# Patient Record
Sex: Female | Born: 1965 | Race: Black or African American | Hispanic: No | Marital: Married | Smoking: Never smoker
Health system: Southern US, Community
[De-identification: ages and names within clinical notes are randomized; demographics above are authoritative.]

## PROBLEM LIST (undated history)

## (undated) DIAGNOSIS — E119 Type 2 diabetes mellitus without complications: Secondary | ICD-10-CM

## (undated) DIAGNOSIS — I10 Essential (primary) hypertension: Secondary | ICD-10-CM

---

## 2005-08-29 ENCOUNTER — Inpatient Hospital Stay (HOSPITAL_COMMUNITY): Admission: AD | Admit: 2005-08-29 | Discharge: 2005-08-29 | Payer: Self-pay | Admitting: Obstetrics and Gynecology

## 2005-09-06 ENCOUNTER — Ambulatory Visit: Payer: Self-pay | Admitting: *Deleted

## 2005-09-11 ENCOUNTER — Ambulatory Visit: Payer: Self-pay | Admitting: *Deleted

## 2005-09-13 ENCOUNTER — Ambulatory Visit: Payer: Self-pay | Admitting: Family Medicine

## 2005-09-27 ENCOUNTER — Ambulatory Visit: Payer: Self-pay | Admitting: Family Medicine

## 2005-09-27 ENCOUNTER — Inpatient Hospital Stay (HOSPITAL_COMMUNITY): Admission: AD | Admit: 2005-09-27 | Discharge: 2005-10-03 | Payer: Self-pay | Admitting: *Deleted

## 2005-09-27 ENCOUNTER — Ambulatory Visit: Payer: Self-pay | Admitting: *Deleted

## 2006-08-14 IMAGING — US US RENAL
1 series · 14 of 25 positions shown · non-contrast
Comparison: none

CLINICAL DATA: Chronic hypertension. 
 RENAL/URINARY TRACT ULTRASOUND:
TECHNIQUE: Complete ultrasound examination of the urinary tract was performed including evaluation of the kidneys, renal collecting systems, and urinary bladder.

[Series 1: us renal · 0.34mm/px · 14 of 33 slices shown]
[im 1/33]
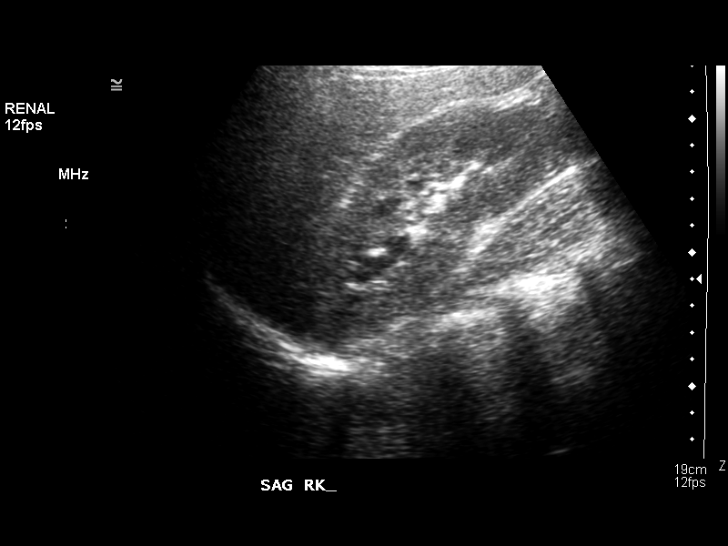
[im 3/33]
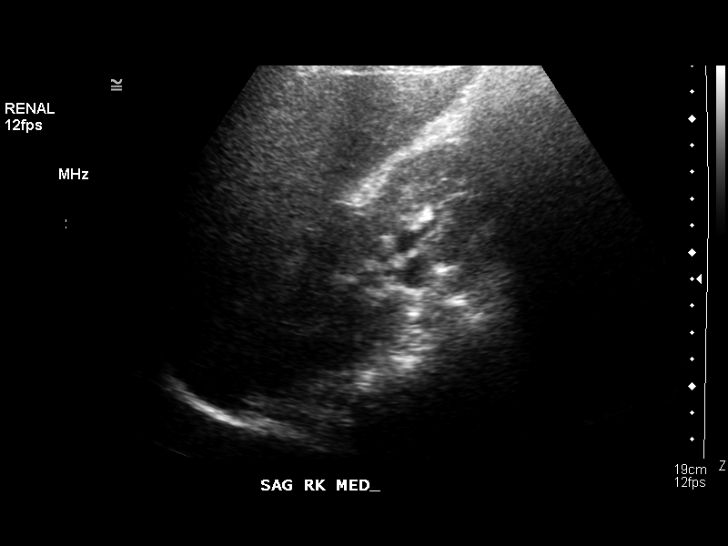
[im 6/33]
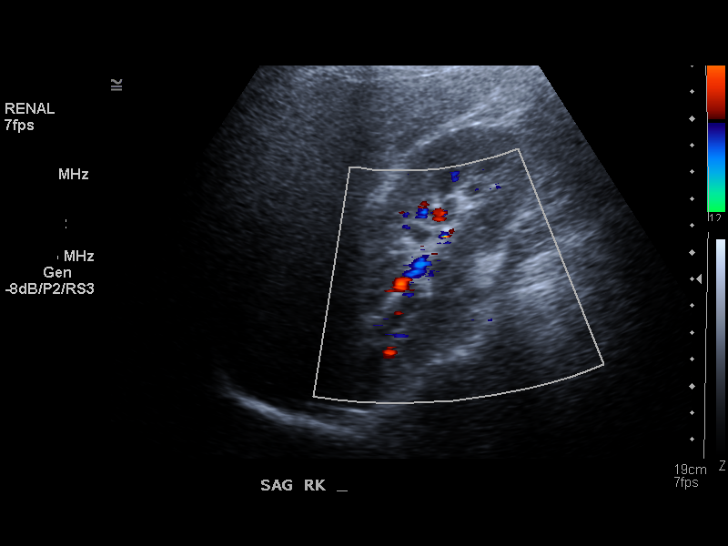
[im 9/33]
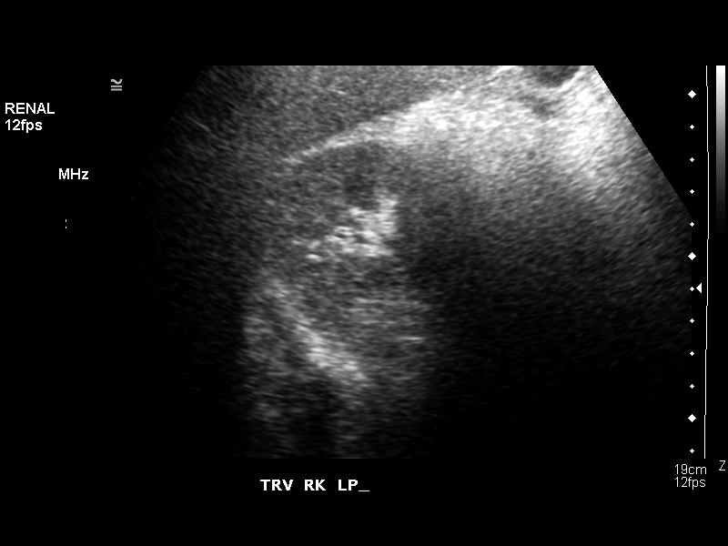
[im 11/33]
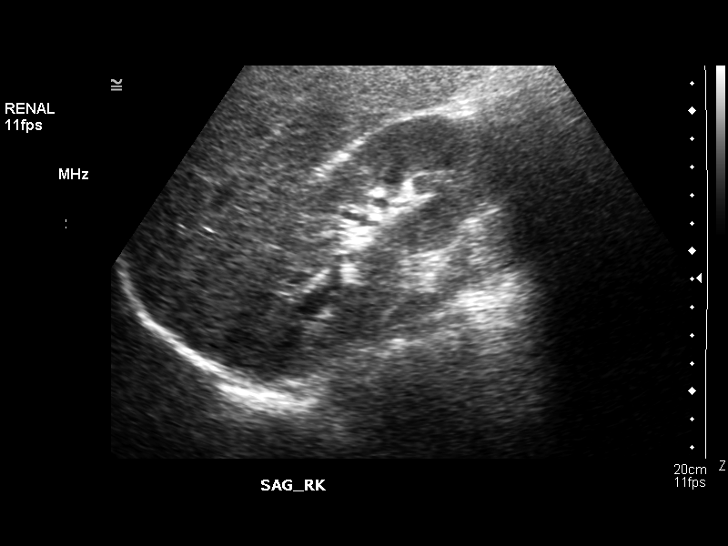
[im 13/33]
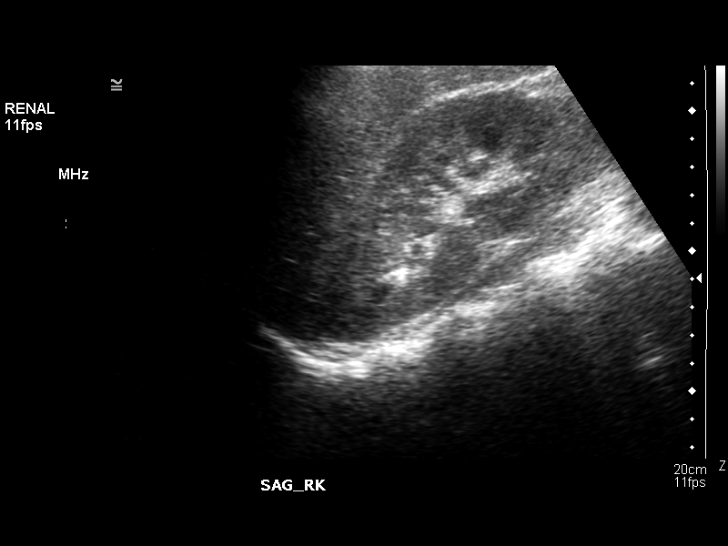
[im 15/33]
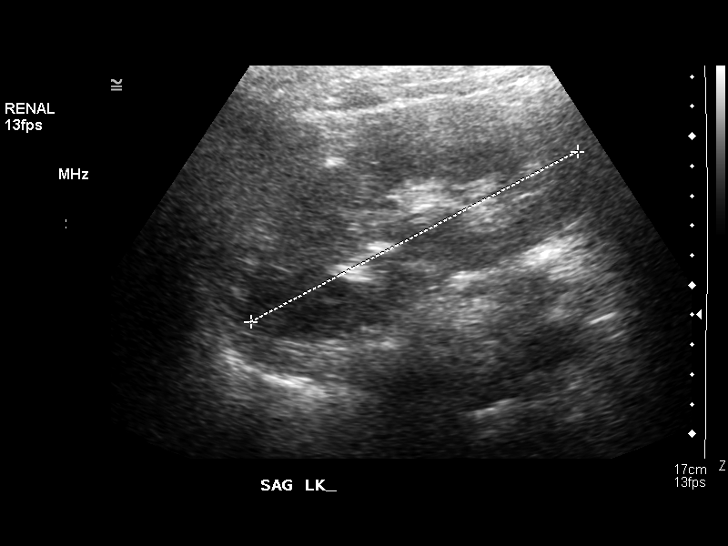
[im 18/33]
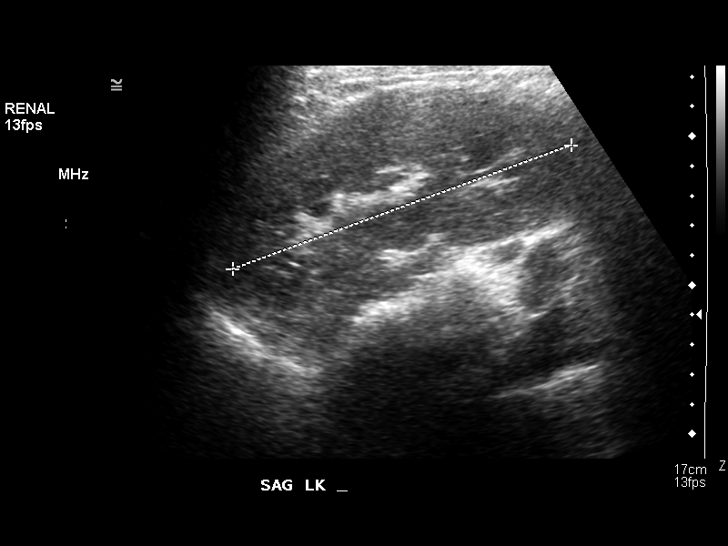
[im 21/33]
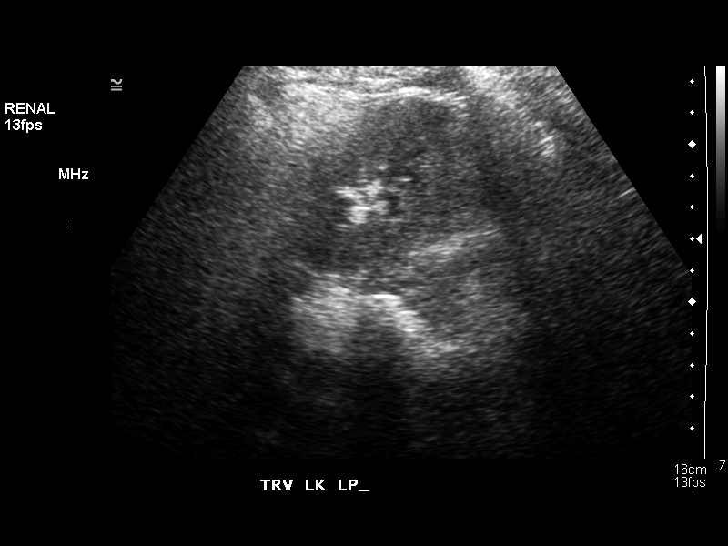
[im 22/33]
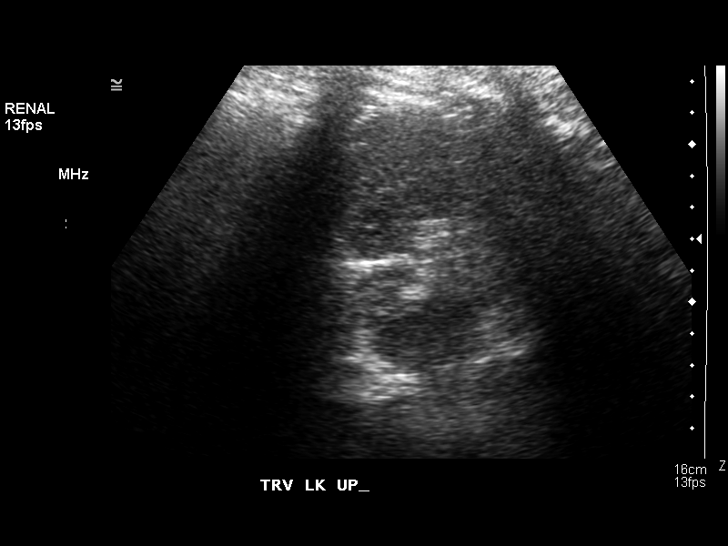
[im 25/33]
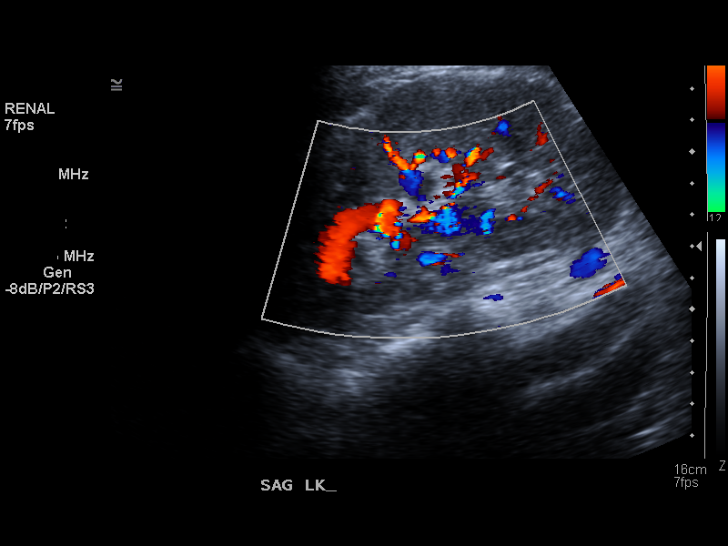
[im 27/33]
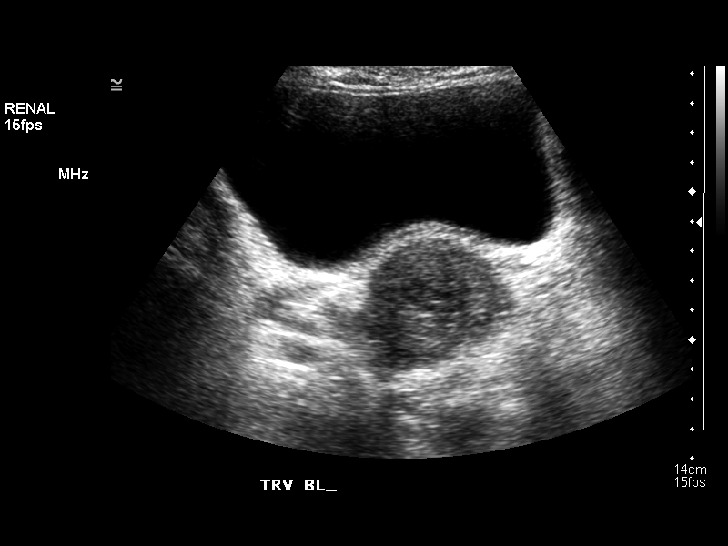
[im 30/33]
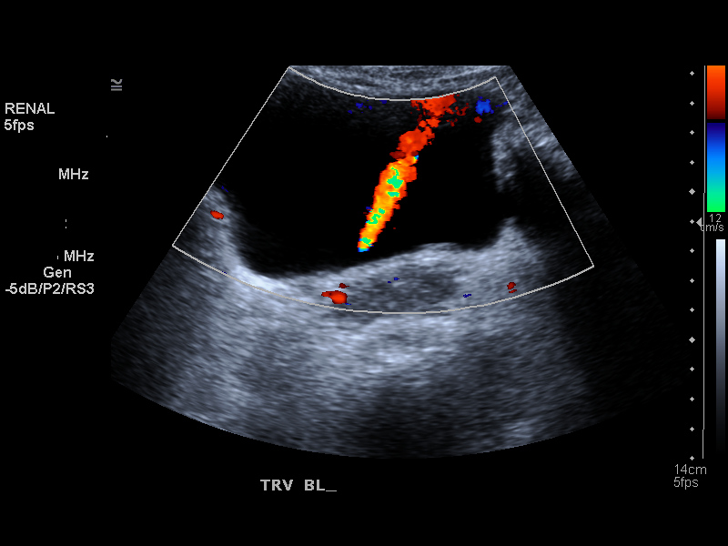
[im 33/33]
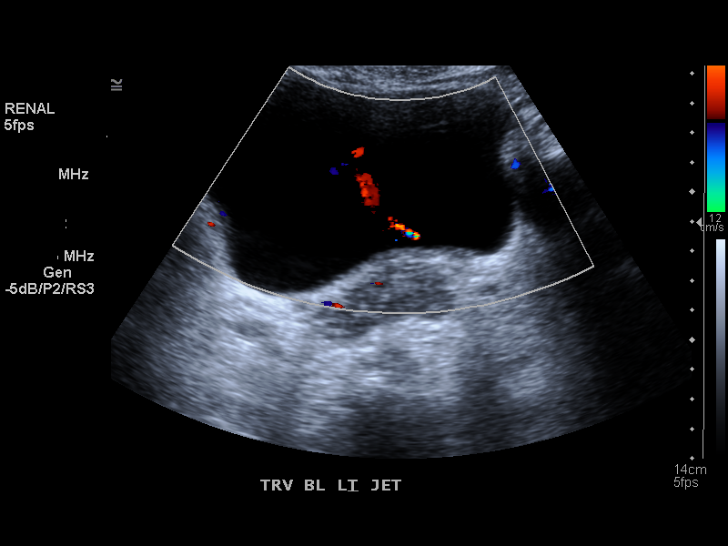

[14 of 25 positions shown; findings below may reference images not displayed]

FINDINGS: Both kidneys are symmetric and within normal limits in size with the right kidney measuring 11.9 cm and the left kidney measuring 12.3 cm.  Both kidneys are normal in parenchymal echogenicity and thickness.  No renal masses or cysts are seen.  There is no evidence of hydronephrosis involving either kidney.  No perinephric abnormalities are identified. 
 Images of the urinary bladder are unremarkable in appearance for the degree of bladder filling, and bilateral ureteral jets are also noted on color Doppler ultrasound.
IMPRESSION: Normal study.  Normal size and appearance of both kidneys.

## 2007-11-09 IMAGING — CR DG CHEST 1V
1 series · 1 of 1 positions shown · non-contrast
Comparison: NONE

CLINICAL DATA: +PPD 

CHEST - SINGLE VIEW (PA)

[view not recorded]
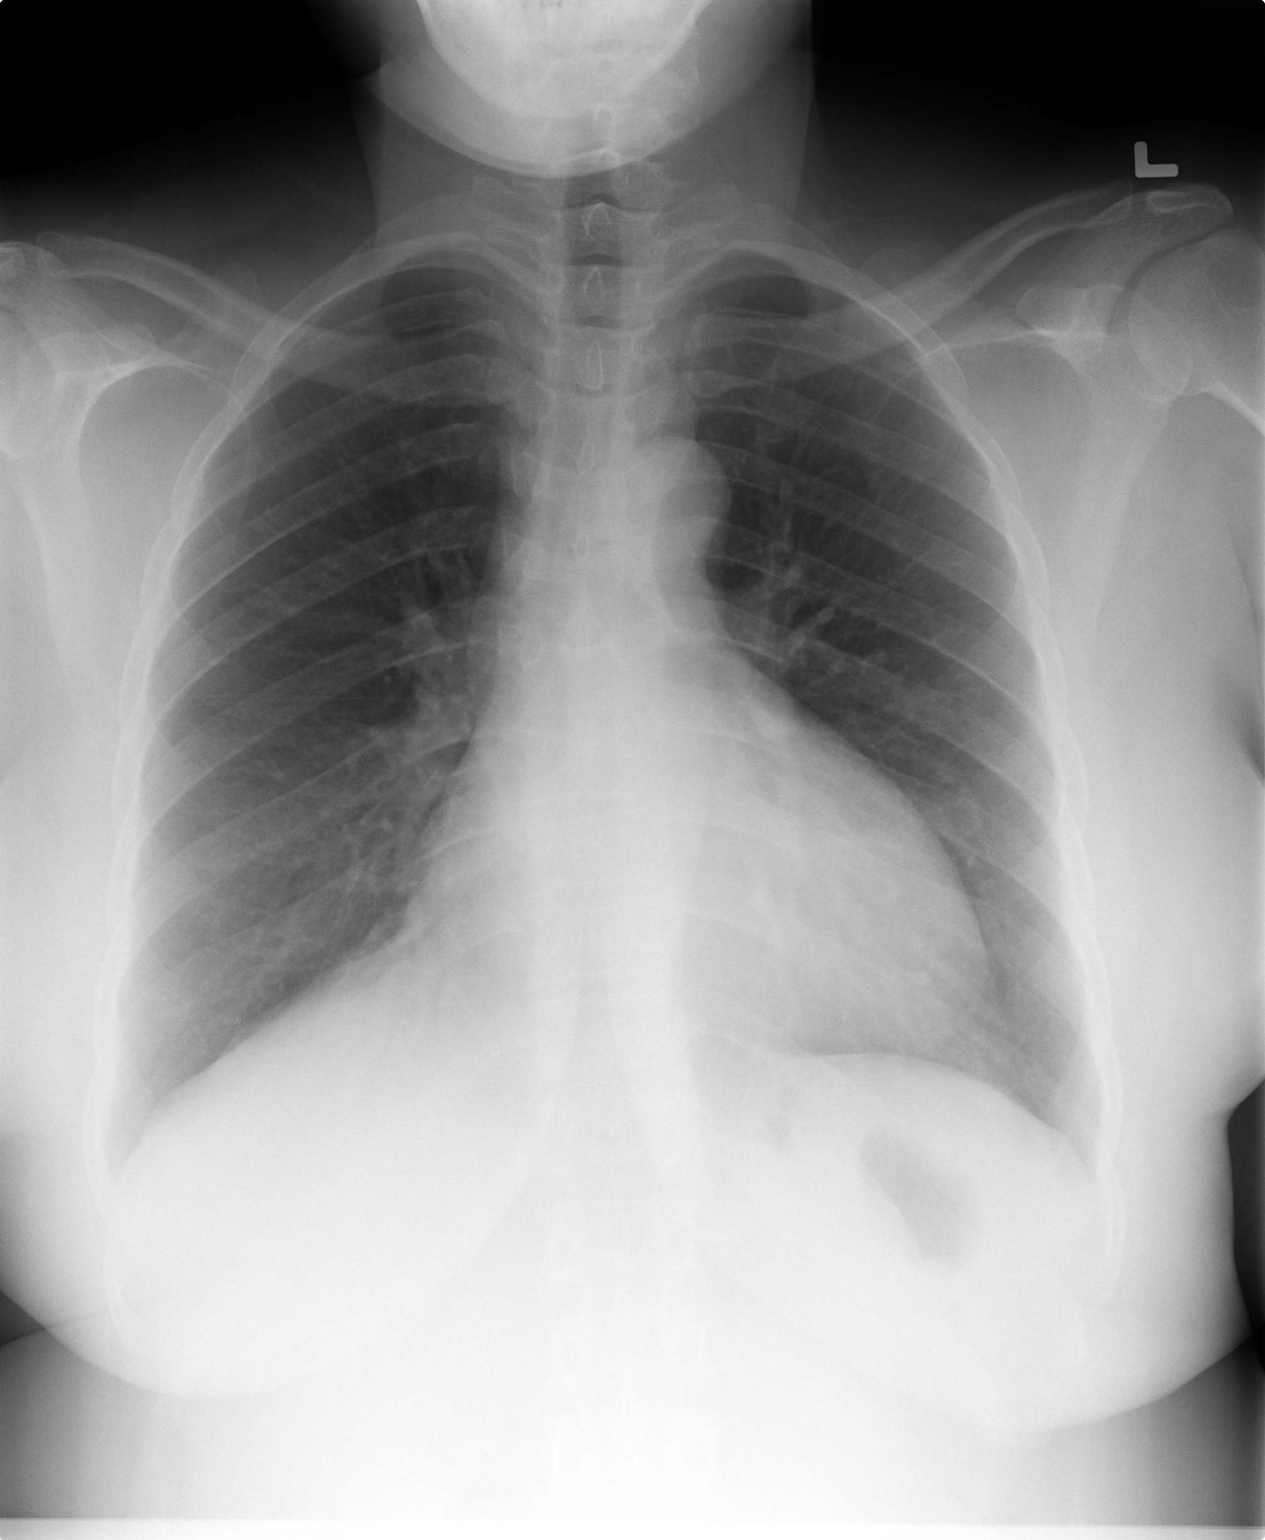

[1 of 1 positions shown; findings below may reference images not displayed]

FINDINGS: The heart size appears to be borderline enlarged. 
Mediastinum is within normal limits. No evidence of focal 
consolidation or interstitial edema. No pleural effusions or 
pneumothorax.
IMPRESSION: Borderline cardiomegaly. No acute pulmonary disease or 
pulmonary edema. No evidence of active tuberculosis. Irit Sagi 
12/30/2006  Tran Date: 12/30/2006 DAS  [REDACTED]

## 2022-12-09 ENCOUNTER — Inpatient Hospital Stay (HOSPITAL_COMMUNITY)
Admission: EM | Admit: 2022-12-09 | Discharge: 2022-12-13 | DRG: 286 | Disposition: A | Discharge status: Home / Self Care | Payer: BLUE CROSS/BLUE SHIELD | Attending: Internal Medicine | Admitting: Internal Medicine

## 2022-12-09 ENCOUNTER — Observation Stay (HOSPITAL_COMMUNITY): Payer: BLUE CROSS/BLUE SHIELD

## 2022-12-09 ENCOUNTER — Other Ambulatory Visit: Payer: Self-pay

## 2022-12-09 ENCOUNTER — Encounter (HOSPITAL_COMMUNITY): Payer: Self-pay | Admitting: Family Medicine

## 2022-12-09 ENCOUNTER — Emergency Department (HOSPITAL_COMMUNITY): Payer: BLUE CROSS/BLUE SHIELD

## 2022-12-09 DIAGNOSIS — E119 Type 2 diabetes mellitus without complications: Secondary | ICD-10-CM

## 2022-12-09 DIAGNOSIS — E876 Hypokalemia: Secondary | ICD-10-CM

## 2022-12-09 DIAGNOSIS — I5022 Chronic systolic (congestive) heart failure: Secondary | ICD-10-CM

## 2022-12-09 DIAGNOSIS — I110 Hypertensive heart disease with heart failure: Secondary | ICD-10-CM | POA: Diagnosis not present

## 2022-12-09 DIAGNOSIS — I509 Heart failure, unspecified: Secondary | ICD-10-CM

## 2022-12-09 DIAGNOSIS — E669 Obesity, unspecified: Secondary | ICD-10-CM | POA: Diagnosis present

## 2022-12-09 DIAGNOSIS — I5023 Acute on chronic systolic (congestive) heart failure: Secondary | ICD-10-CM | POA: Diagnosis present

## 2022-12-09 DIAGNOSIS — E861 Hypovolemia: Secondary | ICD-10-CM | POA: Diagnosis not present

## 2022-12-09 DIAGNOSIS — J9601 Acute respiratory failure with hypoxia: Secondary | ICD-10-CM

## 2022-12-09 DIAGNOSIS — I5031 Acute diastolic (congestive) heart failure: Secondary | ICD-10-CM

## 2022-12-09 DIAGNOSIS — E1165 Type 2 diabetes mellitus with hyperglycemia: Secondary | ICD-10-CM | POA: Diagnosis present

## 2022-12-09 DIAGNOSIS — I10 Essential (primary) hypertension: Secondary | ICD-10-CM | POA: Diagnosis present

## 2022-12-09 DIAGNOSIS — E66812 Obesity, class 2: Secondary | ICD-10-CM | POA: Diagnosis present

## 2022-12-09 DIAGNOSIS — I161 Hypertensive emergency: Secondary | ICD-10-CM

## 2022-12-09 DIAGNOSIS — I2510 Atherosclerotic heart disease of native coronary artery without angina pectoris: Secondary | ICD-10-CM | POA: Diagnosis present

## 2022-12-09 DIAGNOSIS — E871 Hypo-osmolality and hyponatremia: Secondary | ICD-10-CM

## 2022-12-09 DIAGNOSIS — E785 Hyperlipidemia, unspecified: Secondary | ICD-10-CM | POA: Diagnosis present

## 2022-12-09 DIAGNOSIS — I493 Ventricular premature depolarization: Secondary | ICD-10-CM | POA: Diagnosis present

## 2022-12-09 DIAGNOSIS — Z6836 Body mass index (BMI) 36.0-36.9, adult: Secondary | ICD-10-CM

## 2022-12-09 DIAGNOSIS — R739 Hyperglycemia, unspecified: Secondary | ICD-10-CM

## 2022-12-09 DIAGNOSIS — Z596 Low income: Secondary | ICD-10-CM

## 2022-12-09 HISTORY — DX: Type 2 diabetes mellitus without complications: E11.9

## 2022-12-09 HISTORY — DX: Essential (primary) hypertension: I10

## 2022-12-09 LAB — COMPREHENSIVE METABOLIC PANEL
ALT: 26 U/L (ref 0–44)
AST: 43 U/L — ABNORMAL HIGH (ref 15–41)
Albumin: 3.3 g/dL — ABNORMAL LOW (ref 3.5–5.0)
Alkaline Phosphatase: 107 U/L (ref 38–126)
Anion gap: 12 (ref 5–15)
BUN: 9 mg/dL (ref 6–20)
CO2: 23 mmol/L (ref 22–32)
Calcium: 9 mg/dL (ref 8.9–10.3)
Chloride: 98 mmol/L (ref 98–111)
Creatinine, Ser: 0.52 mg/dL (ref 0.44–1.00)
GFR, Estimated: >60 mL/min (ref >60)
Glucose, Bld: 411 mg/dL — ABNORMAL HIGH (ref 70–99)
Potassium: 3.4 mmol/L — ABNORMAL LOW (ref 3.5–5.1)
Sodium: 133 mmol/L — ABNORMAL LOW (ref 135–145)
Total Bilirubin: 0.7 mg/dL (ref 0.3–1.2)
Total Protein: 7.9 g/dL (ref 6.5–8.1)

## 2022-12-09 LAB — ECHOCARDIOGRAM COMPLETE
Area-P 1/2: 6.71 cm2
Height: 65 in
S' Lateral: 4.8 cm
Single Plane A4C EF: 25.8 %

## 2022-12-09 LAB — CBC WITH DIFFERENTIAL/PLATELET
Abs Immature Granulocytes: 0.05 10*3/uL (ref 0.00–0.07)
Basophils Absolute: 0.1 10*3/uL (ref 0.0–0.1)
Basophils Relative: 1 %
Eosinophils Absolute: 0.1 10*3/uL (ref 0.0–0.5)
Eosinophils Relative: 1 %
HCT: 40.1 % (ref 36.0–46.0)
Hemoglobin: 13.7 g/dL (ref 12.0–15.0)
Immature Granulocytes: 0 %
Lymphocytes Relative: 32 %
Lymphs Abs: 3.6 10*3/uL (ref 0.7–4.0)
MCH: 26.7 pg (ref 26.0–34.0)
MCHC: 34.2 g/dL (ref 30.0–36.0)
MCV: 78 fL — ABNORMAL LOW (ref 80.0–100.0)
Monocytes Absolute: 0.5 10*3/uL (ref 0.1–1.0)
Monocytes Relative: 5 %
Neutro Abs: 6.9 10*3/uL (ref 1.7–7.7)
Neutrophils Relative %: 61 %
Platelets: 203 10*3/uL (ref 150–400)
RBC: 5.14 MIL/uL — ABNORMAL HIGH (ref 3.87–5.11)
RDW: 13.6 % (ref 11.5–15.5)
WBC: 11.2 10*3/uL — ABNORMAL HIGH (ref 4.0–10.5)
nRBC: 0 % (ref 0.0–0.2)

## 2022-12-09 LAB — CBG MONITORING, ED
Glucose-Capillary: 328 mg/dL — ABNORMAL HIGH (ref 70–99)
Glucose-Capillary: 254 mg/dL — ABNORMAL HIGH (ref 70–99)
Glucose-Capillary: 110 mg/dL — ABNORMAL HIGH (ref 70–99)

## 2022-12-09 LAB — TROPONIN I (HIGH SENSITIVITY): Troponin I (High Sensitivity): 17 ng/L (ref <18)

## 2022-12-09 LAB — GLUCOSE, CAPILLARY: Glucose-Capillary: 275 mg/dL — ABNORMAL HIGH (ref 70–99)

## 2022-12-09 LAB — BRAIN NATRIURETIC PEPTIDE: B Natriuretic Peptide: 283.5 pg/mL — ABNORMAL HIGH (ref 0.0–100.0)

## 2022-12-09 LAB — HEMOGLOBIN A1C
Hgb A1c MFr Bld: 12.3 % — ABNORMAL HIGH (ref 4.8–5.6)
Mean Plasma Glucose: 306.31 mg/dL

## 2022-12-09 LAB — D-DIMER, QUANTITATIVE: D-Dimer, Quant: 0.78 ug/mL-FEU — ABNORMAL HIGH (ref 0.00–0.50)

## 2022-12-09 MED ORDER — NITROGLYCERIN IN D5W 200-5 MCG/ML-% IV SOLN
0.0000 ug/min | INTRAVENOUS | Status: DC
  Administered 2022-12-09: 5 ug/min via INTRAVENOUS
  Administered 2022-12-09: 10 ug/min via INTRAVENOUS
  Filled 2022-12-09: qty 250

## 2022-12-09 MED ORDER — ENOXAPARIN SODIUM 40 MG/0.4ML IJ SOSY
40.0000 mg | PREFILLED_SYRINGE | Freq: Every day | INTRAMUSCULAR | Status: DC
  Administered 2022-12-09 – 2022-12-10 (×2): 40 mg via SUBCUTANEOUS
  Filled 2022-12-09 (×2): qty 0.4

## 2022-12-09 MED ORDER — FUROSEMIDE 10 MG/ML IJ SOLN
40.0000 mg | Freq: Two times a day (BID) | INTRAMUSCULAR | Status: AC
  Administered 2022-12-09 (×2): 40 mg via INTRAVENOUS
  Filled 2022-12-09 (×2): qty 4

## 2022-12-09 MED ORDER — SODIUM CHLORIDE 0.9 % IV SOLN: 250.0000 mL | INTRAVENOUS | Status: DC | PRN

## 2022-12-09 MED ORDER — LABETALOL HCL 5 MG/ML IV SOLN
20.0000 mg | Freq: Once | INTRAVENOUS | Status: AC
  Administered 2022-12-09: 20 mg via INTRAVENOUS
  Filled 2022-12-09: qty 4

## 2022-12-09 MED ORDER — FUROSEMIDE 20 MG PO TABS
20.0000 mg | ORAL_TABLET | Freq: Every day | ORAL | Status: DC
  Administered 2022-12-10: 20 mg via ORAL
  Filled 2022-12-09: qty 1

## 2022-12-09 MED ORDER — SODIUM CHLORIDE 0.9% FLUSH: 3.0000 mL | INTRAVENOUS | Status: DC | PRN

## 2022-12-09 MED ORDER — ONDANSETRON HCL 4 MG/2ML IJ SOLN: 4.0000 mg | Freq: Four times a day (QID) | INTRAMUSCULAR | Status: DC | PRN

## 2022-12-09 MED ORDER — ACETAMINOPHEN 325 MG PO TABS: 650.0000 mg | ORAL_TABLET | ORAL | Status: DC | PRN

## 2022-12-09 MED ORDER — LABETALOL HCL 5 MG/ML IV SOLN: 20.0000 mg | Freq: Once | INTRAVENOUS | Status: DC

## 2022-12-09 MED ORDER — INSULIN ASPART 100 UNIT/ML IJ SOLN
0.0000 [IU] | Freq: Three times a day (TID) | INTRAMUSCULAR | Status: DC
  Administered 2022-12-09: 11 [IU] via SUBCUTANEOUS
  Administered 2022-12-09: 8 [IU] via SUBCUTANEOUS
  Administered 2022-12-10: 5 [IU] via SUBCUTANEOUS
  Administered 2022-12-10: 3 [IU] via SUBCUTANEOUS
  Administered 2022-12-10: 8 [IU] via SUBCUTANEOUS
  Administered 2022-12-11: 5 [IU] via SUBCUTANEOUS
  Administered 2022-12-11: 2 [IU] via SUBCUTANEOUS
  Administered 2022-12-11: 3 [IU] via SUBCUTANEOUS
  Administered 2022-12-12: 2 [IU] via SUBCUTANEOUS
  Administered 2022-12-12: 3 [IU] via SUBCUTANEOUS
  Administered 2022-12-12 – 2022-12-13 (×2): 5 [IU] via SUBCUTANEOUS

## 2022-12-09 MED ORDER — NITROGLYCERIN 2 % TD OINT
1.0000 [in_us] | TOPICAL_OINTMENT | Freq: Once | TRANSDERMAL | Status: AC
  Administered 2022-12-09: 1 [in_us] via TOPICAL
  Filled 2022-12-09: qty 1

## 2022-12-09 MED ORDER — FUROSEMIDE 10 MG/ML IJ SOLN
40.0000 mg | Freq: Once | INTRAMUSCULAR | Status: AC
  Administered 2022-12-09: 40 mg via INTRAVENOUS
  Filled 2022-12-09: qty 4

## 2022-12-09 MED ORDER — LOSARTAN POTASSIUM 25 MG PO TABS
25.0000 mg | ORAL_TABLET | Freq: Every day | ORAL | Status: DC
  Administered 2022-12-09 – 2022-12-10 (×2): 25 mg via ORAL
  Filled 2022-12-09 (×3): qty 1

## 2022-12-09 MED ORDER — IOHEXOL 350 MG/ML SOLN
50.0000 mL | Freq: Once | INTRAVENOUS | Status: AC | PRN
  Administered 2022-12-09: 50 mL via INTRAVENOUS

## 2022-12-09 MED ORDER — SODIUM CHLORIDE 0.9% FLUSH
3.0000 mL | Freq: Two times a day (BID) | INTRAVENOUS | Status: DC
  Administered 2022-12-09 – 2022-12-13 (×8): 3 mL via INTRAVENOUS

## 2022-12-09 NOTE — Assessment & Plan Note (Addendum)
Hypertensive emergency.  Blood pressure continue to be elevated. Patient on aggressive diuresis with furosemide, empagliflozin and spironolactone.  Change losartan to entresto. May need to add Bidil if persistent uncontrolled hypertension.

## 2022-12-09 NOTE — ED Notes (Signed)
Patient transported to CT 

## 2022-12-09 NOTE — ED Provider Notes (Signed)
[place] Provider Note   CSN: [id number] Arrival date & time: 12/09/22  0214     History  Chief Complaint  Patient presents with   Shortness of Breath    Ruth Edwards is a 57 y.o. female.  The history is provided by the patient.  Shortness of Breath She has no significant past history and comes in because of cough and difficulty breathing at night.  Cough is nonproductive.  She has been up at night 3 times in the last week because of difficulty breathing.  She states everything is fine during the day.  She denies any chest pain, heaviness, tightness, pressure.  She denies any fever or chills.  EMS noted hypoxia with oxygen saturation 84% which did not come up with oxygen alone and she was placed on CPAP with improvement of oxygen saturation.  She was noted to be markedly hypertensive and was given nitroglycerin and aspirin with improvement in blood pressure.  They did note rales diffusely.   Home Medications Prior to Admission medications   Not on File      Allergies    Patient has no allergy information on record.    Review of Systems   Review of Systems  Respiratory:  Positive for shortness of breath.   All other systems reviewed and are negative.   Physical Exam Updated Vital Signs BP (!) 192/129   Pulse (!) 129   Temp 98.3 F (36.8 C) (Oral)   Resp (!) 28   Ht 5' 5"$  (1.651 m)   SpO2 93%  Physical Exam Vitals and nursing note reviewed.   57 year old female, resting comfortably and in no acute distress. Vital signs are significant for elevated respiratory rate, heart rate, markedly elevated blood pressure. Oxygen saturation is 93%, which is normal - but only maintained at this level with supplemental oxygen. Head is normocephalic and atraumatic. PERRLA, EOMI. Oropharynx is clear.  Fundi showed no hemorrhage or exudate. Neck is nontender and supple without adenopathy or JVD. Back is nontender and there is no  CVA tenderness.  There is no presacral edema. Lungs are coarse inspiratory and expiratory rhonchi without overt rales or wheezes. Chest is nontender. Heart has regular rate and rhythm without murmur. Abdomen is soft, flat, nontender. Extremities have 2+ edema, full range of motion is present. Skin is warm and dry without rash. Neurologic: Mental status is normal, cranial nerves are intact, moves all extremities equally.  ED Results / Procedures / Treatments   Labs (all labs ordered are listed, but only abnormal results are displayed) Labs Reviewed  BRAIN NATRIURETIC PEPTIDE - Abnormal; Notable for the following components:      Result Value   B Natriuretic Peptide 283.5 (*)    All other components within normal limits  COMPREHENSIVE METABOLIC PANEL - Abnormal; Notable for the following components:   Sodium 133 (*)    Potassium 3.4 (*)    Glucose, Bld 411 (*)    Albumin 3.3 (*)    AST 43 (*)    All other components within normal limits  CBC WITH DIFFERENTIAL/PLATELET - Abnormal; Notable for the following components:   WBC 11.2 (*)    RBC 5.14 (*)    MCV 78.0 (*)    All other components within normal limits  D-DIMER, QUANTITATIVE - Abnormal; Notable for the following components:   D-Dimer, Quant 0.78 (*)    All other components within normal limits  TROPONIN I (HIGH SENSITIVITY)  EKG EKG Interpretation  Date/Time:  Sunday December 09 2022 02:46:42 EST Ventricular Rate:  97 PR Interval:  152 QRS Duration: 118 QT Interval:  347 QTC Calculation: 441 R Axis:   35 Text Interpretation: Sinus rhythm Ventricular premature complex LVH with secondary repolarization abnormality No old tracing to compare Confirmed by Delora Fuel ([id number]) on 12/09/2022 2:48:32 AM  Radiology CT Angio Chest PE W and/or Wo Contrast  Result Date: 12/09/2022 CLINICAL DATA:  Low to intermediate probability for pulmonary embolism EXAM: CT ANGIOGRAPHY CHEST WITH CONTRAST TECHNIQUE: Multidetector CT  imaging of the chest was performed using the standard protocol during bolus administration of intravenous contrast. Multiplanar CT image reconstructions and MIPs were obtained to evaluate the vascular anatomy. RADIATION DOSE REDUCTION: This exam was performed according to the departmental dose-optimization program which includes automated exposure control, adjustment of the mA and/or kV according to patient size and/or use of iterative reconstruction technique. CONTRAST:  62m OMNIPAQUE IOHEXOL 350 MG/ML SOLN COMPARISON:  Radiograph from earlier today FINDINGS: Cardiovascular: Satisfactory opacification of the pulmonary arteries to the segmental level. No evidence of pulmonary embolism. Enlarged heart size. Coronary atheromatous calcification. Mediastinum/Nodes: No mass or worrisome lymph node. Uncomplicated gas-filled diverticulum at the left paramedian thoracic inlet. Lungs/Pleura: Symmetric airspace disease with small pleural effusions and some interlobular septal thickening. Upper Abdomen: Cholelithiasis. Musculoskeletal: No acute finding Review of the MIP images confirms the above findings. IMPRESSION: 1. CHF pattern. 2. Negative for pulmonary embolism. 3. Atherosclerosis including the coronary arteries. 4. Cholelithiasis. Electronically Signed   By: JJorje GuildM.D.   On: 12/09/2022 05:30   DG Chest Port 1 View  Result Date: 12/09/2022 CLINICAL DATA:  Shortness of breath. EXAM: PORTABLE CHEST 1 VIEW COMPARISON:  PA single view 12/28/2006 FINDINGS: 3:07 a.m. there is mild cardiomegaly. Increased central vascular distension. Interstitial and patchy consolidative opacities are noted in the lower lung fields most likely due to interstitial and alveolar edema, with pneumonic component not excluded. Small pleural effusions are also beginning to form. The upper lung fields are clear. The mediastinum is normally outlined. Mild aortic atherosclerosis. Slight thoracic dextroscoliosis. IMPRESSION: Cardiomegaly  with increased central vascular distension. Interstitial and patchy consolidative opacities in the lower lung fields are most likely due to interstitial and alveolar edema, with pneumonic component not excluded. Electronically Signed   By: KTelford NabM.D.   On: 12/09/2022 03:39    Procedures Procedures  Cardiac monitor shows sinus tachycardia, per my interpretation.  Medications Ordered in ED Medications  labetalol (NORMODYNE) injection 20 mg (has no administration in time range)  nitroGLYCERIN 50 mg in dextrose 5 % 250 mL (0.2 mg/mL) infusion (has no administration in time range)    ED Course/ Medical Decision Making/ A&P                             Medical Decision Making Amount and/or Complexity of Data Reviewed Labs: ordered. Radiology: ordered.  Risk Prescription drug management. Decision regarding hospitalization.   Shortness of breath in the setting of severe hypertension and peripheral edema suggesting hypertensive emergency and pulmonary edema.  However, exam is not typical of pulmonary edema.  I have ordered chest x-ray as well as ECG, laboratory workup including CBC, comprehensive metabolic panel, BNP, troponin, D-dimer.  I have ordered a dose of labetalol and I have ordered a nitroglycerin drip to try to lower her blood pressure.  She has no hospital records since 2006 when she had a cesarean birth.  I have reviewed and interpreted her electrocardiogram, my interpretation is left ventricular hypertrophy with secondary repolarization changes.  Blood pressure has come down significantly with above-noted treatment.  Chest x-ray shows findings of heart failure.  I have independently viewed the image, and agree with the radiologist's interpretation.  I have ordered a dose of furosemide.  I have reviewed and interpreted her laboratory tests, and my interpretation is mild hyponatremia and hypokalemia which are probably not clinically significant, minimally medially elevated AST  which is not clinically significant, elevated glucose which probably represents undiagnosed diabetes, mild leukocytosis which is nonspecific, normal troponin, elevated BNP consistent with clinical diagnosis of heart failure, elevated D-dimer.  Because of elevated D-dimer I have ordered CT angiogram of the chest.  CT angiogram shows heart failure but no evidence of pulmonary embolism, incidental finding of cholelithiasis.  I have independently viewed the images, and agree with the radiologist's interpretation.  I initially discussed the case with Dr. Rudi Rummage of critical care service who evaluated the patient and felt that she was doing well and likely could be taken off of intravenous nitroglycerin and admitted by the hospitalist service.  I have switched to topical nitroglycerin and ordered an additional dose of labetalo.  I discussed case with Dr. Myna Hidalgo of [facility], who agrees to admit the patient.  CRITICAL CARE Performed by: Delora Fuel Total critical care time: 95 minutes Critical care time was exclusive of separately billable procedures and treating other patients. Critical care was necessary to treat or prevent imminent or life-threatening deterioration. Critical care was time spent personally by me on the following activities: development of treatment plan with patient and/or surrogate as well as nursing, discussions with consultants, evaluation of patient's response to treatment, examination of patient, obtaining history from patient or surrogate, ordering and performing treatments and interventions, ordering and review of laboratory studies, ordering and review of radiographic studies, pulse oximetry and re-evaluation of patient's condition.  Final Clinical Impression(s) / ED Diagnoses Final diagnoses:  Hypertensive emergency  Acute heart failure, unspecified heart failure type ([place])  Acute respiratory failure with hypoxemia (HCC)  Hyperglycemia  Hypokalemia  Hyponatremia    Rx  / DC Orders ED Discharge Orders     None         Delora Fuel, MD [id number] [phone]

## 2022-12-09 NOTE — H&P (Signed)
History and Physical    Ruth Edwards [id number] DOB: February 14, 1966 DOA: 12/09/2022  DOS: the patient was seen and examined on 12/09/2022  PCP: Pcp, No   Patient coming from: Home  I have personally briefly reviewed patient's old medical records in [facility]  Ruth Edwards, a 56 y/o with known untreated hypertension reports several days of progressive SOB/DOE. She denies chest pain, pressure or other cardiac symptoms. She has been feeling fit until onset of symptoms. She reports no other known medical issues. She presents for evaluation of her progressive SOB/DOE   ED Course: Afebrile, 195/135 at presentation, 144/92 at admit exam after diuretic and labetolol, HR 84, RR 15. EDP exam unremarkable. In ED received IV lasix and labetolol. CTA c/w CHF, BNP 328, Glucose 411, K 3.4. [facility] called to admit for further evluation and mgt of CHF and newly diagnosed DM2.  Review of Systems:  Review of Systems  Constitutional: Negative.   HENT: Negative.    Eyes: Negative.   Respiratory:  Positive for shortness of breath. Negative for cough.   Cardiovascular:  Positive for chest pain and leg swelling. Negative for palpitations.  Gastrointestinal: Negative.   Genitourinary: Negative.   Musculoskeletal: Negative.   Skin: Negative.   Neurological: Negative.   Endo/Heme/Allergies: Negative.   Psychiatric/Behavioral: Negative.      Past Medical History:  Diagnosis Date   DM2 (diabetes mellitus, type 2) ([place])    HTN (hypertension)     Past Surgical History:  Procedure Laterality Date   CESAREAN SECTION      Soc Hx -  native of [place], in [place] since 2004. She has 3 sons, 1 daughter. 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   reports that she has never smoked. She has never used smokeless tobacco. She reports that she does not drink alcohol and does not use drugs.  Not on File  History reviewed. No pertinent family history.  Prior to Admission medications   Not on File     Physical Exam: Vitals:   12/09/22 0630 12/09/22 0724 12/09/22 0730 12/09/22 0751  BP: (!) 144/92  (!) 162/112   Pulse: 85 84 92   Resp: 19 15 20   $ Temp:    98.1 F (36.7 C)  TempSrc:    Oral  SpO2: 94% 97% 96%   Height:        Physical Exam Constitutional:      Appearance: She is well-developed.     Comments: overweight  HENT:     Head: Normocephalic and atraumatic.     Mouth/Throat:     Mouth: Mucous membranes are moist.  Eyes:     Extraocular Movements: Extraocular movements intact.     Pupils: Pupils are equal, round, and reactive to light.  Neck:     Thyroid: No thyromegaly.     Vascular: No JVD.  Cardiovascular:     Rate and Rhythm: Normal rate and regular rhythm.     Pulses: Normal pulses.     Heart sounds: Normal heart sounds.  Pulmonary:     Effort: Pulmonary effort is normal. No tachypnea or accessory muscle usage.     Breath sounds: Examination of the right-lower field reveals rales. Examination of the left-lower field reveals rales. Rales present. No decreased breath sounds, wheezing or rhonchi.  Chest:     Chest wall: There is no dullness to percussion.  Abdominal:     General: Bowel sounds are normal.     Palpations: Abdomen is soft.  Musculoskeletal:  Cervical back: Normal range of motion and neck supple.     Right lower leg: Edema present.     Left lower leg: Edema present.     Comments: 1+ pitting edema bilteral LE to mid-shin  Skin:    General: Skin is warm and dry.  Neurological:     Mental Status: She is alert and oriented to person, place, and time.     Cranial Nerves: No cranial nerve deficit.     Comments: Fundiscopic exam w/o hemorrhages, AV nicking, copper wiring.  Psychiatric:        Mood and Affect: Mood normal.        Behavior: Behavior normal.      Labs on Admission: I have personally reviewed following labs and imaging studies  CBC: Recent Labs  Lab 12/09/22 0237  WBC 11.2*  NEUTROABS 6.9  HGB 13.7  HCT 40.1  MCV  78.0*  PLT 123456   Basic Metabolic Panel: Recent Labs  Lab 12/09/22 0237  NA 133*  K 3.4*  CL 98  CO2 23  GLUCOSE 411*  BUN 9  CREATININE 0.52  CALCIUM 9.0   GFR: CrCl cannot be calculated (Unknown ideal weight.). Liver Function Tests: Recent Labs  Lab 12/09/22 0237  AST 43*  ALT 26  ALKPHOS 107  BILITOT 0.7  PROT 7.9  ALBUMIN 3.3*   No results for input(s): "LIPASE", "AMYLASE" in the last 168 hours. No results for input(s): "AMMONIA" in the last 168 hours. Coagulation Profile: No results for input(s): "INR", "PROTIME" in the last 168 hours. Cardiac Enzymes: No results for input(s): "CKTOTAL", "CKMB", "CKMBINDEX", "TROPONINI" in the last 168 hours. BNP (last 3 results) No results for input(s): "PROBNP" in the last 8760 hours. HbA1C: No results for input(s): "HGBA1C" in the last 72 hours. CBG: No results for input(s): "GLUCAP" in the last 168 hours. Lipid Profile: No results for input(s): "CHOL", "HDL", "LDLCALC", "TRIG", "CHOLHDL", "LDLDIRECT" in the last 72 hours. Thyroid Function Tests: No results for input(s): "TSH", "T4TOTAL", "FREET4", "T3FREE", "THYROIDAB" in the last 72 hours. Anemia Panel: No results for input(s): "VITAMINB12", "FOLATE", "FERRITIN", "TIBC", "IRON", "RETICCTPCT" in the last 72 hours. Urine analysis: No results found for: "COLORURINE", "APPEARANCEUR", "LABSPEC", "PHURINE", "GLUCOSEU", "HGBUR", "BILIRUBINUR", "KETONESUR", "PROTEINUR", "UROBILINOGEN", "NITRITE", "LEUKOCYTESUR"  Radiological Exams on Admission: I have personally reviewed images CT Angio Chest PE W and/or Wo Contrast  Result Date: 12/09/2022 CLINICAL DATA:  Low to intermediate probability for pulmonary embolism EXAM: CT ANGIOGRAPHY CHEST WITH CONTRAST TECHNIQUE: Multidetector CT imaging of the chest was performed using the standard protocol during bolus administration of intravenous contrast. Multiplanar CT image reconstructions and MIPs were obtained to evaluate the vascular  anatomy. RADIATION DOSE REDUCTION: This exam was performed according to the departmental dose-optimization program which includes automated exposure control, adjustment of the mA and/or kV according to patient size and/or use of iterative reconstruction technique. CONTRAST:  36m OMNIPAQUE IOHEXOL 350 MG/ML SOLN COMPARISON:  Radiograph from earlier today FINDINGS: Cardiovascular: Satisfactory opacification of the pulmonary arteries to the segmental level. No evidence of pulmonary embolism. Enlarged heart size. Coronary atheromatous calcification. Mediastinum/Nodes: No mass or worrisome lymph node. Uncomplicated gas-filled diverticulum at the left paramedian thoracic inlet. Lungs/Pleura: Symmetric airspace disease with small pleural effusions and some interlobular septal thickening. Upper Abdomen: Cholelithiasis. Musculoskeletal: No acute finding Review of the MIP images confirms the above findings. IMPRESSION: 1. CHF pattern. 2. Negative for pulmonary embolism. 3. Atherosclerosis including the coronary arteries. 4. Cholelithiasis. Electronically Signed   By: JGilford SilviusD.  On: 12/09/2022 05:30   DG Chest Port 1 View  Result Date: 12/09/2022 CLINICAL DATA:  Shortness of breath. EXAM: PORTABLE CHEST 1 VIEW COMPARISON:  PA single view 12/28/2006 FINDINGS: 3:07 a.m. there is mild cardiomegaly. Increased central vascular distension. Interstitial and patchy consolidative opacities are noted in the lower lung fields most likely due to interstitial and alveolar edema, with pneumonic component not excluded. Small pleural effusions are also beginning to form. The upper lung fields are clear. The mediastinum is normally outlined. Mild aortic atherosclerosis. Slight thoracic dextroscoliosis. IMPRESSION: Cardiomegaly with increased central vascular distension. Interstitial and patchy consolidative opacities in the lower lung fields are most likely due to interstitial and alveolar edema, with pneumonic component not  excluded. Electronically Signed   By: Telford Nab M.D.   On: 12/09/2022 03:39    EKG: I have personally reviewed EKG: Sinus rhythm with PVC, LVH  Assessment/Plan Principal Problem:   Acute CHF (congestive heart failure) (HCC) Active Problems:   Essential hypertension   DM2 (diabetes mellitus, type 2) (HCC)    Assessment and Plan: * Acute CHF (congestive heart failure) (HCC) No previous dx cardiac disease. She has untreated hypertension. She has not seen a doctor for a great while. She has been asymptomatic. Until recent onset of SOB/DOE w/o chest pain, w/o precipitating factors. In ED CHF by CTA, CXR with BNP 328. She was given IV furosemide with good results  Plan Education about heart failure  2D echo  Two additional doses of IV lasix q 12 then po lasix 20 mg daily  Start ARB - losartan 25 mg daily  CHF protocols  Essential hypertension Patient endorses long-standing untreated hypertension. At presentation BP 195/135! IN ED she was given IV lasix and IV labetolol with good response.  Plan Continue diuretic - lasix 20 mg daily  Losartan 25 mg daily  Monitor for adequate control  F/u Bmet 12/10/22  DM2 (diabetes mellitus, type 2) ([place]) Newly diagnosed diabetes with serum glucose of 411.  Plan A1C  Ss coverage while in-patient  RD consult for diet education  Long term treatment to be based on A1C results - candidate for Farixga - but $$$ may be a barrier   Disposition Home in 24-48 hours   TOC consult for finding primary care    DVT prophylaxis: Lovenox Code Status: Full Code Family Communication: husband present during exam. Answered all questions  Disposition Plan: home 24-48 hrs  Consults called: none  Admission status: Observation, Telemetry bed   Adella Hare, MD [facility] 12/09/2022, 8:25 AM

## 2022-12-09 NOTE — Progress Notes (Signed)
  Echocardiogram 2D Echocardiogram has been performed.  Ruth Edwards 12/09/2022, 3:23 PM

## 2022-12-09 NOTE — Subjective & Objective (Signed)
Ruth Edwards, a 57 y/o with known untreated hypertension reports several days of progressive SOB/DOE. She denies chest pain, pressure or other cardiac symptoms. She has been feeling fit until onset of symptoms. She reports no other known medical issues. She presents for evaluation of her progressive SOB/DOE

## 2022-12-09 NOTE — Assessment & Plan Note (Signed)
No previous dx cardiac disease. She has untreated hypertension. She has not seen a doctor for a great while. She has been asymptomatic. Until recent onset of SOB/DOE w/o chest pain, w/o precipitating factors. In ED CHF by CTA, CXR with BNP 328. She was given IV furosemide with good results  Plan Education about heart failure  2D echo  Two additional doses of IV lasix q 12 then po lasix 20 mg daily  Start ARB - losartan 25 mg daily  CHF protocols

## 2022-12-09 NOTE — ED Triage Notes (Signed)
Pt bib [facility] c/o severe cough + sob for 20 min, per EMS pt 84% RA, lung sounds decreased/rales, placed on cpap, spo2 92%. BP 260/170, given 3 nitro + 324 ASA, BP then 190/160. No past medical hx due to pt not going to doctor.

## 2022-12-09 NOTE — ED Notes (Signed)
Hospitalist at bedside 

## 2022-12-09 NOTE — ED Notes (Addendum)
Per PCCM, pt to be weaned off nitro. Nitro paused at this time

## 2022-12-09 NOTE — Assessment & Plan Note (Signed)
Uncontrolled hyperglycemia.  Fasting glucose is 250 mg Continue insulin sliding scale for glucose cover and monitoring Will add metformin during this hospitalization

## 2022-12-09 NOTE — ED Notes (Signed)
ED TO INPATIENT HANDOFF REPORT  ED Nurse Name and Phone #: Luetta Nutting [phone]  S Name/Age/Gender Ruth Edwards 57 y.o. female Room/Bed: 004C/004C  Code Status   Code Status: Full Code  Home/SNF/Other Home Patient oriented to: self, place, time, and situation Is this baseline? Yes   Triage Complete: Triage complete  Chief Complaint Acute CHF (congestive heart failure) ([place]) [I50.9]  Triage Note Pt bib [facility] c/o severe cough + sob for 20 min, per EMS pt 84% RA, lung sounds decreased/rales, placed on cpap, spo2 92%. BP 260/170, given 3 nitro + 324 ASA, BP then 190/160. No past medical hx due to pt not going to doctor.   Allergies Not on File  Level of Care/Admitting Diagnosis ED Disposition     ED Disposition  Admit   Condition  --   [place]: [place] [[id number]]  Level of Care: Telemetry Medical [104]  May place patient in observation at [facility] or [facility] if equivalent level of care is available:: Yes  Covid Evaluation: Asymptomatic - no recent exposure (last 10 days) testing not required  Diagnosis: Acute CHF (congestive heart failure) [facility]) [id number]  Admitting Physician: Neena Rhymes [[id number]]  Attending Physician: Neena Rhymes [[id number]]          B Medical/Surgery History Past Medical History:  Diagnosis Date   DM2 (diabetes mellitus, type 2) ([place])    HTN (hypertension)    Past Surgical History:  Procedure Laterality Date   CESAREAN SECTION       A IV Location/Drains/Wounds Patient Lines/Drains/Airways Status     Active Line/Drains/Airways     Name Placement date Placement time Site Days   Peripheral IV 12/09/22 20 G Right Antecubital 12/09/22  --  Antecubital  less than 1            Intake/Output Last 24 hours  Intake/Output Summary (Last 24 hours) at 12/09/2022 1308 Last data filed at 12/09/2022 [id number] Gross per 24 hour  Intake 7.62 ml  Output 1300 ml  Net -1292.38 ml     Labs/Imaging Results for orders placed or performed during the hospital encounter of 12/09/22 (from the past 48 hour(s))  Brain natriuretic peptide     Status: Abnormal   Collection Time: 12/09/22  2:37 AM  Result Value Ref Range   B Natriuretic Peptide 283.5 (H) 0.0 - 100.0 pg/mL    Comment: Performed at [facility], [address], [place], [place] [address]  Comprehensive metabolic panel     Status: Abnormal   Collection Time: 12/09/22  2:37 AM  Result Value Ref Range   Sodium 133 (L) 135 - 145 mmol/L   Potassium 3.4 (L) 3.5 - 5.1 mmol/L   Chloride 98 98 - 111 mmol/L   CO2 23 22 - 32 mmol/L   Glucose, Bld 411 (H) 70 - 99 mg/dL    Comment: Glucose reference range applies only to samples taken after fasting for at least 8 hours.   BUN 9 6 - 20 mg/dL   Creatinine, Ser 0.52 0.44 - 1.00 mg/dL   Calcium 9.0 8.9 - 10.3 mg/dL   Total Protein 7.9 6.5 - 8.1 g/dL   Albumin 3.3 (L) 3.5 - 5.0 g/dL   AST 43 (H) 15 - 41 U/L   ALT 26 0 - 44 U/L   Alkaline Phosphatase 107 38 - 126 U/L   Total Bilirubin 0.7 0.3 - 1.2 mg/dL   GFR, Estimated >60 >60 mL/min    Comment: (  NOTE) Calculated using the CKD-EPI Creatinine Equation (2021)    Anion gap 12 5 - 15    Comment: Performed at [facility], [place] [address] [place], [place] [address]  Troponin I (High Sensitivity)     Status: None   Collection Time: 12/09/22  2:37 AM  Result Value Ref Range   Troponin I (High Sensitivity) 17 <18 ng/L    Comment: (NOTE) Elevated high sensitivity troponin I (hsTnI) values and significant  changes across serial measurements may suggest ACS but many other  chronic and acute conditions are known to elevate hsTnI results.  Refer to the "Links" section for chest pain algorithms and additional  guidance. Performed at [facility], [place] [address] [place], [place] [address]   CBC with Differential     Status: Abnormal   Collection Time: 12/09/22  2:37 AM  Result Value Ref Range   WBC 11.2  (H) 4.0 - 10.5 K/uL   RBC 5.14 (H) 3.87 - 5.11 MIL/uL   Hemoglobin 13.7 12.0 - 15.0 g/dL   HCT 40.1 36.0 - 46.0 %   MCV 78.0 (L) 80.0 - 100.0 fL   MCH 26.7 26.0 - 34.0 pg   MCHC 34.2 30.0 - 36.0 g/dL   RDW 13.6 11.5 - 15.5 %   Platelets 203 150 - 400 K/uL    Comment: REPEATED TO VERIFY   nRBC 0.0 0.0 - 0.2 %   Neutrophils Relative % 61 %   Neutro Abs 6.9 1.7 - 7.7 K/uL   Lymphocytes Relative 32 %   Lymphs Abs 3.6 0.7 - 4.0 K/uL   Monocytes Relative 5 %   Monocytes Absolute 0.5 0.1 - 1.0 K/uL   Eosinophils Relative 1 %   Eosinophils Absolute 0.1 0.0 - 0.5 K/uL   Basophils Relative 1 %   Basophils Absolute 0.1 0.0 - 0.1 K/uL   Immature Granulocytes 0 %   Abs Immature Granulocytes 0.05 0.00 - 0.07 K/uL    Comment: Performed at [facility] [address], [place], [place] [address]  D-dimer, quantitative     Status: Abnormal   Collection Time: 12/09/22  2:37 AM  Result Value Ref Range   D-Dimer, Quant 0.78 (H) 0.00 - 0.50 ug/mL-FEU    Comment: (NOTE) At the manufacturer cut-off value of 0.5 g/mL FEU, this assay has a negative predictive value of 95-100%.This assay is intended for use in conjunction with a clinical pretest probability (PTP) assessment model to exclude pulmonary embolism (PE) and deep venous thrombosis (DVT) in outpatients suspected of PE or DVT. Results should be correlated with clinical presentation. Performed at [facility], [place] [address], [place], [place] [address]   CBG monitoring, ED     Status: Abnormal   Collection Time: 12/09/22  9:23 AM  Result Value Ref Range   Glucose-Capillary 328 (H) 70 - 99 mg/dL    Comment: Glucose reference range applies only to samples taken after fasting for at least 8 hours.  CBG monitoring, ED     Status: Abnormal   Collection Time: 12/09/22 12:31 PM  Result Value Ref Range   Glucose-Capillary 254 (H) 70 - 99 mg/dL    Comment: Glucose reference range applies only to samples taken after fasting for at  least 8 hours.   CT Angio Chest PE W and/or Wo Contrast  Result Date: 12/09/2022 CLINICAL DATA:  Low to intermediate probability for pulmonary embolism EXAM: CT ANGIOGRAPHY CHEST WITH CONTRAST TECHNIQUE: Multidetector CT imaging of the  chest was performed using the standard protocol during bolus administration of intravenous contrast. Multiplanar CT image reconstructions and MIPs were obtained to evaluate the vascular anatomy. RADIATION DOSE REDUCTION: This exam was performed according to the departmental dose-optimization program which includes automated exposure control, adjustment of the mA and/or kV according to patient size and/or use of iterative reconstruction technique. CONTRAST:  34m OMNIPAQUE IOHEXOL 350 MG/ML SOLN COMPARISON:  Radiograph from earlier today FINDINGS: Cardiovascular: Satisfactory opacification of the pulmonary arteries to the segmental level. No evidence of pulmonary embolism. Enlarged heart size. Coronary atheromatous calcification. Mediastinum/Nodes: No mass or worrisome lymph node. Uncomplicated gas-filled diverticulum at the left paramedian thoracic inlet. Lungs/Pleura: Symmetric airspace disease with small pleural effusions and some interlobular septal thickening. Upper Abdomen: Cholelithiasis. Musculoskeletal: No acute finding Review of the MIP images confirms the above findings. IMPRESSION: 1. CHF pattern. 2. Negative for pulmonary embolism. 3. Atherosclerosis including the coronary arteries. 4. Cholelithiasis. Electronically Signed   By: JJorje GuildM.D.   On: 12/09/2022 05:30   DG Chest Port 1 View  Result Date: 12/09/2022 CLINICAL DATA:  Shortness of breath. EXAM: PORTABLE CHEST 1 VIEW COMPARISON:  PA single view 12/28/2006 FINDINGS: 3:07 a.m. there is mild cardiomegaly. Increased central vascular distension. Interstitial and patchy consolidative opacities are noted in the lower lung fields most likely due to interstitial and alveolar edema, with pneumonic component  not excluded. Small pleural effusions are also beginning to form. The upper lung fields are clear. The mediastinum is normally outlined. Mild aortic atherosclerosis. Slight thoracic dextroscoliosis. IMPRESSION: Cardiomegaly with increased central vascular distension. Interstitial and patchy consolidative opacities in the lower lung fields are most likely due to interstitial and alveolar edema, with pneumonic component not excluded. Electronically Signed   By: KTelford NabM.D.   On: 12/09/2022 03:39    Pending Labs Unresulted Labs (From admission, onward)     Start     Ordered   12/16/22 0500  Creatinine, serum  (enoxaparin (LOVENOX)    CrCl >/= 30 ml/min)  Weekly,   R     Comments: while on enoxaparin therapy    12/09/22 0813   12/10/22 [id number] Basic metabolic panel  Daily,   R     Comments: As Scheduled for 5 days    12/09/22 0813   12/09/22 1110  Hemoglobin A1c  Once,   R        12/09/22 1110            Vitals/Pain Today's Vitals   12/09/22 1030 12/09/22 1100 12/09/22 1130 12/09/22 1243  BP: (!) 154/102 (!) 150/100 (!) 150/100   Pulse: 88 87 91   Resp: (!) 21 (!) 21 17   Temp:    98.2 F (36.8 C)  TempSrc:      SpO2: 97% 94% 99%   Height:      PainSc:        Isolation Precautions No active isolations  Medications Medications  labetalol (NORMODYNE) injection 20 mg (20 mg Intravenous Not Given 12/09/22 0544)  sodium chloride flush (NS) 0.9 % injection 3 mL (3 mLs Intravenous Given 12/09/22 0926)  sodium chloride flush (NS) 0.9 % injection 3 mL (has no administration in time range)  0.9 %  sodium chloride infusion (has no administration in time range)  acetaminophen (TYLENOL) tablet 650 mg (has no administration in time range)  ondansetron (ZOFRAN) injection 4 mg (has no administration in time range)  enoxaparin (LOVENOX) injection 40 mg (40 mg Subcutaneous Given 12/09/22 0919)  furosemide (  LASIX) tablet 20 mg (has no administration in time range)  furosemide (LASIX)  injection 40 mg (40 mg Intravenous Given 12/09/22 0920)  losartan (COZAAR) tablet 25 mg (25 mg Oral Given 12/09/22 0919)  insulin aspart (novoLOG) injection 0-15 Units (8 Units Subcutaneous Given 12/09/22 1236)  labetalol (NORMODYNE) injection 20 mg (20 mg Intravenous Given 12/09/22 0242)  furosemide (LASIX) injection 40 mg (40 mg Intravenous Given 12/09/22 0424)  labetalol (NORMODYNE) injection 20 mg (20 mg Intravenous Given 12/09/22 0545)  iohexol (OMNIPAQUE) 350 MG/ML injection 50 mL (50 mLs Intravenous Contrast Given 12/09/22 0509)  nitroGLYCERIN (NITROGLYN) 2 % ointment 1 inch (1 inch Topical Given 12/09/22 0548)    Mobility walks     Focused Assessments Pulmonary Assessment Handoff:  Lung sounds: Bilateral Breath Sounds: Clear L Breath Sounds: Diminished R Breath Sounds: Diminished O2 Device: Nasal Cannula O2 Flow Rate (L/min): 4 L/min    R Recommendations: See Admitting Provider Note  Report given to:   Additional Notes:

## 2022-12-10 ENCOUNTER — Encounter (HOSPITAL_COMMUNITY): Payer: Self-pay | Admitting: Family Medicine

## 2022-12-10 ENCOUNTER — Other Ambulatory Visit (HOSPITAL_COMMUNITY): Payer: Self-pay

## 2022-12-10 DIAGNOSIS — E876 Hypokalemia: Secondary | ICD-10-CM | POA: Diagnosis present

## 2022-12-10 DIAGNOSIS — E871 Hypo-osmolality and hyponatremia: Secondary | ICD-10-CM | POA: Diagnosis present

## 2022-12-10 DIAGNOSIS — Z596 Low income: Secondary | ICD-10-CM | POA: Diagnosis not present

## 2022-12-10 DIAGNOSIS — I5023 Acute on chronic systolic (congestive) heart failure: Secondary | ICD-10-CM | POA: Diagnosis present

## 2022-12-10 DIAGNOSIS — E66812 Obesity, class 2: Secondary | ICD-10-CM | POA: Diagnosis present

## 2022-12-10 DIAGNOSIS — E669 Obesity, unspecified: Secondary | ICD-10-CM | POA: Diagnosis present

## 2022-12-10 DIAGNOSIS — I110 Hypertensive heart disease with heart failure: Secondary | ICD-10-CM | POA: Diagnosis present

## 2022-12-10 DIAGNOSIS — I161 Hypertensive emergency: Secondary | ICD-10-CM | POA: Diagnosis present

## 2022-12-10 DIAGNOSIS — I10 Essential (primary) hypertension: Secondary | ICD-10-CM

## 2022-12-10 DIAGNOSIS — I2510 Atherosclerotic heart disease of native coronary artery without angina pectoris: Secondary | ICD-10-CM | POA: Diagnosis present

## 2022-12-10 DIAGNOSIS — E861 Hypovolemia: Secondary | ICD-10-CM | POA: Diagnosis not present

## 2022-12-10 DIAGNOSIS — I509 Heart failure, unspecified: Secondary | ICD-10-CM

## 2022-12-10 DIAGNOSIS — I493 Ventricular premature depolarization: Secondary | ICD-10-CM | POA: Diagnosis present

## 2022-12-10 DIAGNOSIS — I5022 Chronic systolic (congestive) heart failure: Secondary | ICD-10-CM

## 2022-12-10 DIAGNOSIS — E1165 Type 2 diabetes mellitus with hyperglycemia: Secondary | ICD-10-CM | POA: Diagnosis present

## 2022-12-10 DIAGNOSIS — E785 Hyperlipidemia, unspecified: Secondary | ICD-10-CM | POA: Diagnosis present

## 2022-12-10 DIAGNOSIS — J9601 Acute respiratory failure with hypoxia: Secondary | ICD-10-CM | POA: Diagnosis present

## 2022-12-10 DIAGNOSIS — E1159 Type 2 diabetes mellitus with other circulatory complications: Secondary | ICD-10-CM

## 2022-12-10 DIAGNOSIS — Z6836 Body mass index (BMI) 36.0-36.9, adult: Secondary | ICD-10-CM | POA: Diagnosis not present

## 2022-12-10 LAB — BASIC METABOLIC PANEL
Anion gap: 12 (ref 5–15)
BUN: 14 mg/dL (ref 6–20)
CO2: 27 mmol/L (ref 22–32)
Calcium: 8.9 mg/dL (ref 8.9–10.3)
Chloride: 99 mmol/L (ref 98–111)
Creatinine, Ser: 0.69 mg/dL (ref 0.44–1.00)
GFR, Estimated: >60 mL/min (ref >60)
Glucose, Bld: 250 mg/dL — ABNORMAL HIGH (ref 70–99)
Potassium: 2.9 mmol/L — ABNORMAL LOW (ref 3.5–5.1)
Sodium: 138 mmol/L (ref 135–145)

## 2022-12-10 LAB — GLUCOSE, CAPILLARY
Glucose-Capillary: 263 mg/dL — ABNORMAL HIGH (ref 70–99)
Glucose-Capillary: 159 mg/dL — ABNORMAL HIGH (ref 70–99)
Glucose-Capillary: 237 mg/dL — ABNORMAL HIGH (ref 70–99)
Glucose-Capillary: 145 mg/dL — ABNORMAL HIGH (ref 70–99)

## 2022-12-10 MED ORDER — POTASSIUM CHLORIDE CRYS ER 20 MEQ PO TBCR
40.0000 meq | EXTENDED_RELEASE_TABLET | ORAL | Status: AC
  Administered 2022-12-10 (×2): 40 meq via ORAL
  Filled 2022-12-10 (×2): qty 2

## 2022-12-10 MED ORDER — EMPAGLIFLOZIN 10 MG PO TABS
10.0000 mg | ORAL_TABLET | Freq: Every day | ORAL | Status: DC
  Administered 2022-12-10 – 2022-12-13 (×3): 10 mg via ORAL
  Filled 2022-12-10 (×3): qty 1

## 2022-12-10 MED ORDER — SPIRONOLACTONE 12.5 MG HALF TABLET
12.5000 mg | ORAL_TABLET | Freq: Every day | ORAL | Status: DC
  Administered 2022-12-10 – 2022-12-11 (×2): 12.5 mg via ORAL
  Filled 2022-12-10 (×2): qty 1

## 2022-12-10 MED ORDER — FUROSEMIDE 10 MG/ML IJ SOLN
60.0000 mg | Freq: Two times a day (BID) | INTRAMUSCULAR | Status: DC
  Administered 2022-12-10 – 2022-12-11 (×3): 60 mg via INTRAVENOUS
  Filled 2022-12-10 (×3): qty 6

## 2022-12-10 MED ORDER — GLUCERNA SHAKE PO LIQD
237.0000 mL | Freq: Three times a day (TID) | ORAL | Status: DC
  Administered 2022-12-10 – 2022-12-13 (×8): 237 mL via ORAL

## 2022-12-10 MED ORDER — LIVING WELL WITH DIABETES BOOK
Freq: Once | Status: AC
  Filled 2022-12-10: qty 1

## 2022-12-10 NOTE — TOC Progression Note (Signed)
Transition of Care [facility]) - Progression Note    Patient Details  Name: Ruth Edwards MRN: [id number] Date of Birth: February 10, 1966  Transition of Care [facility]) CM/SW Contact  Zenon Mayo, RN Phone Number: 12/10/2022, 5:32 PM  Clinical Narrative:    from home with spouse, never been to a doctor before, bp elevated, edema, EF 25-30, A1C is 12.  No insurance, no PCP.  TOC following.        Expected Discharge Plan and Services                                               Social Determinants of Health (SDOH) Interventions SDOH Screenings   Food Insecurity: No Food Insecurity (12/10/2022)  Housing: Low Risk  (12/10/2022)  Transportation Needs: No Transportation Needs (12/10/2022)  Utilities: Not At Risk (12/10/2022)  Alcohol Screen: Low Risk  (12/10/2022)  Financial Resource Strain: High Risk (12/10/2022)  Tobacco Use: Low Risk  (12/10/2022)    Readmission Risk Interventions     No data to display

## 2022-12-10 NOTE — Progress Notes (Signed)
Heart Failure Nurse Navigator Progress Note  PCP: Pcp, No PCP-Cardiologist: None Admission Diagnosis: Hypertensive emergency, Acute heart failure, Acute respiratory failure with hypoxemia, Hyperglycemia, Hypokalemia, Hyponatremia.  Admitted from: Home  Presentation:   Danae Linehan presented with cough and shortness of breath, EMS arrived sats were 84%, placed on CPAP. 2+ edema, Hypertensive, given nitroglycerin and aspirin. BP 192/129, HR 129, BNP 283.5, CXR with cardiomegaly, increased central vascular distention and edema. CTA with CHF with out PE.   Patient and her brother were educated on the sign and symptoms of heart failure, daily weights, when to call her doctor or go to the ED. Diet/ fluid restrictions, taking all medications as prescribed and attending all medical appointments. Patient and her brother verbalized they understood. A hospital HF TOC was scheduled for 12/26/2022 @ 3 pm.   ECHO/ LVEF: 25-30% HFrEF  Clinical Course:  Past Medical History:  Diagnosis Date   DM2 (diabetes mellitus, type 2) ([facility])    HTN (hypertension)      Social History   Socioeconomic History   Marital status: Married    Spouse name: Not on file   Number of children: Not on file   Years of education: Not on file   Highest education level: Not on file  Occupational History   Not on file  Tobacco Use   Smoking status: Never   Smokeless tobacco: Never  Substance and Sexual Activity   Alcohol use: Never   Drug use: Never   Sexual activity: Yes  Other Topics Concern   Not on file  Social History Narrative   Not on file   Social Determinants of Health   Financial Resource Strain: Not on file  Food Insecurity: No Food Insecurity (12/10/2022)   Hunger Vital Sign    Worried About Running Out of Food in the Last Year: Never true    Ran Out of Food in the Last Year: Never true  Transportation Needs: No Transportation Needs (12/10/2022)   PRAPARE - Radiographer, therapeutic (Medical): No    Lack of Transportation (Non-Medical): No  Physical Activity: Not on file  Stress: Not on file  Social Connections: Not on file   Education Assessment and Provision:  Detailed education and instructions provided on heart failure disease management including the following:  Signs and symptoms of Heart Failure When to call the physician Importance of daily weights Low sodium diet Fluid restriction Medication management Anticipated future follow-up appointments  Patient education given on each of the above topics.  Patient acknowledges understanding via teach back method and acceptance of all instructions.  Education Materials:  "Living Better With Heart Failure" Booklet, HF zone tool, & Daily Weight Tracker Tool.  Patient has scale at home: yes Patient has pill box at home: NA    High Risk Criteria for Readmission and/or Poor Patient Outcomes: Heart failure hospital admissions (last 6 months): 0  No Show rate: 0 Difficult social situation: No Demonstrates medication adherence: Has not been on any medications.  Primary Language: English  Literacy level: Reading, writing, comprehension  Barriers of Care:   Medication compliance Diet/ fluid restrictions/ daily weights  Considerations/Referrals:   Referral made to Heart Failure Pharmacist Stewardship: Yes Referral made to Heart Failure CSW/NCM TOC: No Referral made to Heart & Vascular TOC clinic: Yes, 12/26/2022 @ 3 pm  Items for Follow-up on DC/TOC: Medication compliance Diet/ fluids/ daily weights Continued HF education   Earnestine Leys, BSN, RN Heart Failure Transport planner Only

## 2022-12-10 NOTE — Progress Notes (Addendum)
   Heart Failure Stewardship Pharmacist Progress Note   PCP: Pcp, No PCP-Cardiologist: None    HPI:  57 yo F with known untreated hypertension.  She presented to the ED on 2/11 with shortness of breath, orthopnea, and cough. CXR with cardiomegaly, increased central vascular distention, and edema. CTA suggestive of CHF without PE. An ECHO was done on 2/11 and LVEF is reduced to 25-30%, global hypokinesis, mild LVH, RV normal.  Current HF Medications: Diuretic: furosemide 20 mg PO daily ACE/ARB/ARNI: losartan 25 mg daily  Prior to admission HF Medications: None  Pertinent Lab Values: Serum creatinine 0.69, BUN 14, Potassium 2.9, Sodium 138, BNP 283.5, A1c 12.3   Vital Signs: Weight: 185 lbs (admission weight: 186 lbs) Blood pressure: 150-180/100s  Heart rate: 90s  I/O: -1.5L yesterday; net -1.6L  Medication Assistance / Insurance Benefits Check: Does the patient have prescription insurance?  Yes Type of insurance plan: [employer]  Outpatient Pharmacy:  Prior to admission outpatient pharmacy: None Is the patient willing to use [facility] at discharge? Yes Is the patient willing to transition their outpatient pharmacy to utilize a [facility] outpatient pharmacy?   Pending    Assessment: 1. Acute systolic CHF (LVEF 63-87%), due to unknown etiology, no ischemic evaluation completed yet. NYHA class II symptoms. - Continue furosemide 20 mg daily. Strict I/Os and daily weights. Keep K>4 and Mg>2. Need to check magnesium. KCl 40 mEq x 2 ordered for replacement.  - Consider starting carvedilol 6.25 mg BID - Continue losartan 25 mg daily, consider optimizing to Entresto prior to discharge - Consider adding spironolactone prior to discharge - Caution SGLT2i with A1c 12.3% - elevated risk for UTI  Plan: 1) Medication changes recommended at this time: - Start carvedilol 6.25 mg BID - Start spironolactone 25 mg daily  2) Patient assistance: - Entresto copay $15,  copay card lowers to $10 per fill - Farxiga copay $15, copay card lowers to $0 per month - Will provide pill box before discharge  3)  Education  - To be completed prior to discharge  Kerby Nora, PharmD, BCPS Heart Failure Stewardship Pharmacist Phone [phone]

## 2022-12-10 NOTE — Plan of Care (Signed)
Nutrition Education Note  RD consulted for nutrition education regarding CHF and diabetes.  Lab Results  Component Value Date   HGBA1C 12.3 (H) 12/09/2022     RD provided "Heart Healthy, Consistent Carbohydrate Nutrition Therapy" handout from the Academy of Nutrition and Dietetics. Reviewed patient's dietary recall. Pt states that her appetite has been limited within the last 2 weeks. She recalls her typical intake is 2 meals per day which includes tea or coffee for breakfast with eggs and dinner may include rice and a variety of other food items. She watches her 3 grandchildren throughout the day which keeps her busy.   Discussed handout provided. Discouraged intake of processed foods and use of salt shaker. Encouraged fresh fruits and vegetables as well as whole grain sources of carbohydrates to maximize fiber intake.   RD discussed why it is important for patient to adhere to diet recommendations, and emphasized the role of fluids, foods to avoid, and importance of weighing self daily.   Discussed different food groups and their effects on blood sugar, emphasizing carbohydrate-containing foods. Provided list of carbohydrates and recommended serving sizes of common foods. Encourage increasing meal/snack frequency throughout the day to help maintain stable blood sugar levels. Ordered Glucerna shakes for patient to try during admission as these would be an acceptable supplement to include in daily nutrition regimen.   Discussed importance of controlled and consistent carbohydrate intake throughout the day. Provided examples of ways to balance meals/snacks and encouraged intake of high-fiber, whole grain complex carbohydrates. Teach back method used.  Expect fair compliance. Handout left on bedside table for patient to review page by page and will inform RN if addition needs/questions arise prior to discharge.   Body mass index is 36.29 kg/m. Pt meets criteria for obesity based on current  BMI.  Current diet order is Heart Healthy/ Carb modified, no documented meal completions on file at this time. Labs and medications reviewed. No further nutrition interventions warranted at this time. RD contact information provided. If additional nutrition issues arise, please re-consult RD.   Clayborne Dana, RDN, LDN Clinical Nutrition

## 2022-12-10 NOTE — Hospital Course (Signed)
[name] was admitted to the hospital with the working diagnosis of heart failure exacerbation.   57 yo female with the past medical history of hypertension, who presented with dyspnea. Progressive and worsening symptoms that prompted her to come to the ED. On her initial physical examination his blood pressure was 195/135, HR 85, RR 15 and 02 saturation 94%, lungs with no wheezing, but positive bilateral rales, heart with S1 and S2 present and rhythmic, abdomen with no distention and positive bilateral lower extremity edema.   Na 133, K 3,4 CL 98, bicarbonate 23, glucose 411, bun 9 cr 0,52  BNP 283  Wbc 11.2 hgb 13,7 plt 203  High sensitive troponin 17  D dimer 0,78   Chest radiograph with cardiomegaly with bilateral hilar vascular congestion and bilateral symmetric interstitial infiltrates.   CT chest with bilateral ground glass opacities, predominantly central. No effusions. No pulmonary embolism.   EKG 97 bpm, normal axis deviation, normal intervals, sinus rhythm with PVC, no significant ST segment changes, negative T wave V4 to V6.   02/13 patient is responding well to diuresis. Consulted heart failure team, and plan for cardiac catheterization right and left tomorrow.

## 2022-12-10 NOTE — Assessment & Plan Note (Signed)
Calculated BMI is 36.2

## 2022-12-10 NOTE — Plan of Care (Signed)
  Problem: Coping: Goal: Ability to adjust to condition or change in health will improve Outcome: Progressing   Problem: Fluid Volume: Goal: Ability to maintain a balanced intake and output will improve Outcome: Progressing   Problem: Health Behavior/Discharge Planning: Goal: Ability to identify and utilize available resources and services will improve Outcome: Progressing Goal: Ability to manage health-related needs will improve Outcome: Progressing   Problem: Education: Goal: Knowledge of General Education information will improve Description: Including pain rating scale, medication(s)/side effects and non-pharmacologic comfort measures Outcome: Progressing   Problem: Activity: Goal: Risk for activity intolerance will decrease Outcome: Progressing   Problem: Elimination: Goal: Will not experience complications related to urinary retention Outcome: Progressing

## 2022-12-10 NOTE — Assessment & Plan Note (Signed)
Echocardiogram with reduced LV systolic function with EF 25 to 30%, global hypokinesis, RV systolic function preserved, no significant valvular disease.   Urine output is [id number] cc Systolic blood pressure 123456 with diastolic 98 to 123456 mmHg,  Plan to continue diuresis with furosemide, increase dose to 60 mg IV q12 hrs Add SGLT 2 inh and spironolactone. Continue with losartan and possible transition to entresto.

## 2022-12-10 NOTE — Inpatient Diabetes Management (Signed)
Inpatient Diabetes Program Recommendations  AACE/ADA: New Consensus Statement on Inpatient Glycemic Control (2015)  Target Ranges:  Prepandial:   less than 140 mg/dL      Peak postprandial:   less than 180 mg/dL (1-2 hours)      Critically ill patients:  140 - 180 mg/dL   Lab Results  Component Value Date   GLUCAP 159 (H) 12/10/2022   HGBA1C 12.3 (H) 12/09/2022    Review of Glycemic Control  Diabetes history: [place] Outpatient Diabetes medications: none Current orders for Inpatient glycemic control: Novolog 0-15 units TID correction scale   Inpatient Diabetes Program Recommendations:   Spoke with patient at the bedside about the new onset diagnosis of diabetes. Discussed her hgbA1C of 12.3% which would be close to 300 mg/dl CBGs on an average. Noted that she had never been told that she had prediabetes. Reviewed treatments for diabetes including diet, exercise, and medications. Discussed "plate method" for eating; dietician had seen patient. Patient will need PCP after discharge. Ordered Living Well with Diabetes booklet from pharmacy. Will need prescription for home blood glucose meter kit at discharge. (Order [phone])   Will continue to monitor blood sugars while in the hospital. Patient states that she has health insurance.   Harvel Ricks RN BSN CDE Diabetes Coordinator Pager: [phone]  8am-5pm

## 2022-12-10 NOTE — Assessment & Plan Note (Signed)
Renal function with serum cr at 0.69 with K at 2,9 and serum bicarbonate at 27. Plan to continue K correction with Kcl 80 meq in 2 divided doses.  Follow up renal function and electrolytes in am.

## 2022-12-10 NOTE — Progress Notes (Addendum)
Progress Note   Patient: Ruth Edwards [id number] DOB: July 11, 1966 DOA: 12/09/2022     1 DOS: the patient was seen and examined on 12/10/2022   Brief hospital course: [name] was admitted to the hospital with the working diagnosis of heart failure exacerbation.   57 yo female with the past medical history of hypertension, who presented with dyspnea. Progressive and worsening symptoms that prompted her to come to the ED. On her initial physical examination his blood pressure was 195/135, HR 85, RR 15 and 02 saturation 94%, lungs with no wheezing, but positive bilateral rales, heart with S1 and S2 present and rhythmic, abdomen with no distention and positive bilateral lower extremity edema.   Na 133, K 3,4 CL 98, bicarbonate 23, glucose 411, bun 9 cr 0,52  BNP 283  Wbc 11.2 hgb 13,7 plt 203  High sensitive troponin 17  D dimer 0,78   Chest radiograph with cardiomegaly with bilateral hilar vascular congestion and bilateral symmetric interstitial infiltrates.   CT chest with bilateral ground glass opacities, predominantly central. No effusions. No pulmonary embolism.   EKG 97 bpm, normal axis deviation, normal intervals, sinus rhythm with PVC, no significant ST segment changes, negative T wave V4 to V6.    Assessment and Plan: * Acute on chronic systolic CHF (congestive heart failure) (HCC) Echocardiogram with reduced LV systolic function with EF 25 to 30%, global hypokinesis, RV systolic function preserved, no significant valvular disease.   Urine output is [id number] cc Systolic blood pressure 123456 with diastolic 98 to 123456 mmHg,  Plan to continue diuresis with furosemide, increase dose to 60 mg IV q12 hrs Add SGLT 2 inh and spironolactone. Continue with losartan and possible transition to entresto.   Essential hypertension Hypertensive emergency.  Blood pressure continue to be elevated. Plan to continue aggressive diuresis with furosemide, empagliflozin and  spironolactone.  Continue with losartan  Possible addition of Bidil.   Hypokalemia Renal function with serum cr at 0.69 with K at 2,9 and serum bicarbonate at 27. Plan to continue K correction with Kcl 80 meq in 2 divided doses.  Follow up renal function and electrolytes in am.   DM2 (diabetes mellitus, type 2) (HCC) Uncontrolled hyperglycemia.  Fasting glucose is 250 mg Continue insulin sliding scale for glucose cover and monitoring Will add metformin during this hospitalization   Class 2 obesity Calculated BMI is 36.2         Subjective: Patient with no chest pain, dyspnea has been improving but not back to baseline   Physical Exam: Vitals:   12/09/22 2007 12/09/22 2031 12/10/22 0052 12/10/22 0450  BP: (!) 169/111  (!) 144/98 (!) 179/105  Pulse: 99  87 (!) 101  Resp: 18  18 18  $ Temp: 98.7 F (37.1 C)  98.4 F (36.9 C) 98.4 F (36.9 C)  TempSrc: Oral  Oral Oral  SpO2: 98%  96% 97%  Weight:  84.8 kg  84.3 kg  Height:  5' (1.524 m)     Neurology awake and alert ENT with no pallor Cardiovascular with S1 and S2 present with S3 gallops, with no murmurs, rubs.  Positive JVD up to the jaw  Positive lower extremity edema ++  Respiratory with mild rales at bases with no wheezing or rhonchi Abdomen with no distention   Data Reviewed:    Family Communication: her brother is at the bedside   Disposition: Status is: Observation The patient will require care spanning > 2 midnights and should be moved to inpatient because: heart  failure   Planned Discharge Destination: Home    Author: Tawni Millers, MD 12/10/2022 4:24 PM  For on call review [email or web address].

## 2022-12-11 DIAGNOSIS — I5021 Acute systolic (congestive) heart failure: Secondary | ICD-10-CM

## 2022-12-11 DIAGNOSIS — I110 Hypertensive heart disease with heart failure: Secondary | ICD-10-CM

## 2022-12-11 LAB — URINALYSIS, ROUTINE W REFLEX MICROSCOPIC
Bilirubin Urine: NEGATIVE
Glucose, UA: >=500 mg/dL — AB
Hgb urine dipstick: NEGATIVE
Ketones, ur: 20 mg/dL — AB
Leukocytes,Ua: NEGATIVE
Nitrite: NEGATIVE
Protein, ur: NEGATIVE mg/dL
Specific Gravity, Urine: 1.015 (ref 1.005–1.030)
pH: 6 (ref 5.0–8.0)

## 2022-12-11 LAB — BASIC METABOLIC PANEL
Anion gap: 10 (ref 5–15)
BUN: 15 mg/dL (ref 6–20)
CO2: 28 mmol/L (ref 22–32)
Calcium: 9.3 mg/dL (ref 8.9–10.3)
Chloride: 101 mmol/L (ref 98–111)
Creatinine, Ser: 0.61 mg/dL (ref 0.44–1.00)
GFR, Estimated: >60 mL/min (ref >60)
Glucose, Bld: 174 mg/dL — ABNORMAL HIGH (ref 70–99)
Potassium: 3.6 mmol/L (ref 3.5–5.1)
Sodium: 139 mmol/L (ref 135–145)

## 2022-12-11 LAB — GLUCOSE, CAPILLARY
Glucose-Capillary: 146 mg/dL — ABNORMAL HIGH (ref 70–99)
Glucose-Capillary: 201 mg/dL — ABNORMAL HIGH (ref 70–99)
Glucose-Capillary: 152 mg/dL — ABNORMAL HIGH (ref 70–99)
Glucose-Capillary: 156 mg/dL — ABNORMAL HIGH (ref 70–99)

## 2022-12-11 LAB — TROPONIN I (HIGH SENSITIVITY): Troponin I (High Sensitivity): 18 ng/L — ABNORMAL HIGH (ref <18)

## 2022-12-11 LAB — MAGNESIUM: Magnesium: 1.8 mg/dL (ref 1.7–2.4)

## 2022-12-11 MED ORDER — SODIUM CHLORIDE 0.9% FLUSH
3.0000 mL | Freq: Two times a day (BID) | INTRAVENOUS | Status: DC
  Administered 2022-12-11 – 2022-12-13 (×3): 3 mL via INTRAVENOUS

## 2022-12-11 MED ORDER — CARVEDILOL 3.125 MG PO TABS
3.1250 mg | ORAL_TABLET | Freq: Two times a day (BID) | ORAL | Status: DC
  Administered 2022-12-11 – 2022-12-13 (×4): 3.125 mg via ORAL
  Filled 2022-12-11 (×4): qty 1

## 2022-12-11 MED ORDER — ENOXAPARIN SODIUM 40 MG/0.4ML IJ SOSY
40.0000 mg | PREFILLED_SYRINGE | INTRAMUSCULAR | Status: DC
  Administered 2022-12-11 – 2022-12-12 (×2): 40 mg via SUBCUTANEOUS
  Filled 2022-12-11 (×2): qty 0.4

## 2022-12-11 MED ORDER — SPIRONOLACTONE 25 MG PO TABS
25.0000 mg | ORAL_TABLET | Freq: Every day | ORAL | Status: DC
  Administered 2022-12-13: 25 mg via ORAL
  Filled 2022-12-11: qty 1

## 2022-12-11 MED ORDER — ATORVASTATIN CALCIUM 40 MG PO TABS
40.0000 mg | ORAL_TABLET | Freq: Every day | ORAL | Status: DC
  Administered 2022-12-11: 40 mg via ORAL
  Filled 2022-12-11: qty 1

## 2022-12-11 MED ORDER — MAGNESIUM SULFATE 2 GM/50ML IV SOLN
2.0000 g | Freq: Once | INTRAVENOUS | Status: AC
  Administered 2022-12-11: 2 g via INTRAVENOUS
  Filled 2022-12-11: qty 50

## 2022-12-11 MED ORDER — SACUBITRIL-VALSARTAN 49-51 MG PO TABS
1.0000 | ORAL_TABLET | Freq: Two times a day (BID) | ORAL | Status: DC
  Administered 2022-12-11 (×2): 1 via ORAL
  Filled 2022-12-11 (×2): qty 1

## 2022-12-11 MED ORDER — POTASSIUM CHLORIDE CRYS ER 20 MEQ PO TBCR
40.0000 meq | EXTENDED_RELEASE_TABLET | Freq: Once | ORAL | Status: AC
  Administered 2022-12-11: 40 meq via ORAL
  Filled 2022-12-11: qty 2

## 2022-12-11 NOTE — Progress Notes (Addendum)
Progress Note   Patient: Ruth Edwards [id number] DOB: 1966/03/09 DOA: 12/09/2022     2 DOS: the patient was seen and examined on 12/11/2022   Brief hospital course: Ruth Edwards was admitted to the hospital with the working diagnosis of heart failure exacerbation.   57 yo female with the past medical history of hypertension, who presented with dyspnea. Progressive and worsening symptoms that prompted her to come to the ED. On her initial physical examination his blood pressure was 195/135, HR 85, RR 15 and 02 saturation 94%, lungs with no wheezing, but positive bilateral rales, heart with S1 and S2 present and rhythmic, abdomen with no distention and positive bilateral lower extremity edema.   Na 133, K 3,4 CL 98, bicarbonate 23, glucose 411, bun 9 cr 0,52  BNP 283  Wbc 11.2 hgb 13,7 plt 203  High sensitive troponin 17  D dimer 0,78   Chest radiograph with cardiomegaly with bilateral hilar vascular congestion and bilateral symmetric interstitial infiltrates.   CT chest with bilateral ground glass opacities, predominantly central. No effusions. No pulmonary embolism.   EKG 97 bpm, normal axis deviation, normal intervals, sinus rhythm with PVC, no significant ST segment changes, negative T wave V4 to V6.   02/13 patient is responding well to diuresis. Consulted heart failure team, and plan for cardiac catheterization right and left tomorrow.    Assessment and Plan: * Acute on chronic systolic CHF (congestive heart failure) (HCC) Echocardiogram with reduced LV systolic function with EF 25 to 30%, global hypokinesis, RV systolic function preserved, no significant valvular disease.   Urine output is 123XX123 cc Systolic blood pressure 123XX123 to Q000111Q with diastolic 97 to 99991111  mmHg,  Continue with furosemide 60 mg IV q12 hrs On SGLT 2 inh and spironolactone. Today changed losartan to entresto. If blood pressure continue to be elevated will plan to add Bidil.  Further work up with  cardiac catheterization left and right.  May need cardiac MRI.   Acute hypoxemic respiratory failure due to acute cardiogenic pulmonary edema.  Continue diuresis and oxymetry monitoring.  Her 02 saturation today is 100%  Essential hypertension Hypertensive emergency.  Blood pressure continue to be elevated. Patient on aggressive diuresis with furosemide, empagliflozin and spironolactone.  Change losartan to entresto. May need to add Bidil if persistent uncontrolled hypertension.   Hypokalemia Renal function with serum cr at 0,61 with K at 3,6 and serum bicarbonate at 28 Mg is 1,8 and NA 139  Plan to add 2 g mag sulfate and 40 meq Kcl today to prevent hypokalemia and hypomagnesemia.  Follow up renal function in am, continue diuresis with spironolactone (increased dose to 25 mg), empagliflozin and furosemide.   DM2 (diabetes mellitus, type 2) (HCC) Uncontrolled hyperglycemia.  Fasting glucose is 174. Capillary 145, 146 and 201.  Hgb A1c 12,3   Continue insulin sliding scale for glucose cover and monitoring Plan to add metformin after cardiac catheterization.  Will hold on basal insulin for now.   Class 2 obesity Calculated BMI is 36.2         Subjective: Patient with improvement in dyspnea, no chest pain, edema lower extremities is improving as well.   Physical Exam: Vitals:   12/10/22 0450 12/10/22 2007 12/10/22 2334 12/11/22 0532  BP: (!) 179/105 (!) 195/126 (!) 155/97 (!) 161/101  Pulse: (!) 101 (!) 121 (!) 101 (!) 109  Resp: 18 16 18 18  $ Temp: 98.4 F (36.9 C) 98.5 F (36.9 C)  98.2 F (36.8 C)  TempSrc:  Oral Oral  Oral  SpO2: 97% 100% 100% 100%  Weight: 84.3 kg   83.1 kg  Height:       Neurology awake and alert ENT with on pallor Cardiovascular with S1 and S2 present and rhythmic with no gallops, rubs or murmurs Mild JVD Lower extremity edema +  Respiratory with no rales or wheezing, no rhonchi Abdomen with no distention  Data  Reviewed:    Family Communication: his family is at the bedside   Disposition: Status is: Inpatient Remains inpatient appropriate because: IV diuresis and cardiac workup  Planned Discharge Destination: Home    Author: Tawni Millers, MD 12/11/2022 1:43 PM  For on call review [email or web address].

## 2022-12-11 NOTE — Progress Notes (Signed)
   Heart Failure Stewardship Pharmacist Progress Note   PCP: Pcp, No PCP-Cardiologist: None    HPI:  57 yo F with known untreated hypertension.  She presented to the ED on 2/11 with shortness of breath, orthopnea, and cough. CXR with cardiomegaly, increased central vascular distention, and edema. CTA suggestive of CHF without PE. An ECHO was done on 2/11 and LVEF is reduced to 25-30%, global hypokinesis, mild LVH, RV normal.  Current HF Medications: Diuretic: furosemide 60 mg IV BID ACE/ARB/ARNI: Entresto 49/51 mg BID MRA: spironolactone 12.5 mg daily SGLT2i: Jardiance 10 mg daily  Prior to admission HF Medications: None  Pertinent Lab Values: Serum creatinine 0.61, BUN 15, Potassium 3.6, Sodium 139, Magnesium 1.8, BNP 283.5, A1c 12.3   Vital Signs: Weight: 183 lbs (admission weight: 186 lbs) Blood pressure: 160/100s  Heart rate: 90s  I/O: -1L yesterday; net -2.4L  Medication Assistance / Insurance Benefits Check: Does the patient have prescription insurance?  Yes Type of insurance plan: [employer]  Outpatient Pharmacy:  Prior to admission outpatient pharmacy: None Is the patient willing to use [facility] at discharge? Yes Is the patient willing to transition their outpatient pharmacy to utilize a [facility] outpatient pharmacy?   Pending    Assessment: 1. Acute systolic CHF (LVEF 81-85%), due to unknown etiology, no ischemic evaluation completed yet. NYHA class II symptoms. - Continue furosemide 60 mg IV BID. Strict I/Os and daily weights. Keep K>4 and Mg>2. Recommend KCl 40 mEq x 1 and Mg 2g IV x 1 for replacement.  - Consider starting carvedilol 6.25 mg BID once more euvolemic - Agree with transitioning losartan to Entresto 49/51 mg BID - Continue spironolactone 12.5 mg daily - On Jardiance 10 mg daily, A1c 12.3% - elevated risk for UTI. Will continue to provide education on s/sx of UTI  Plan: 1) Medication changes recommended at this time: - KCl  40 mEq x 1 and Mg 2g IV x 1  2) Patient assistance: - Entresto copay $15, copay card lowers to $10 per fill - Farxiga copay $15, copay card lowers to $0 per month - Will provide pill box before discharge  3)  Education  - To be completed prior to discharge  Kerby Nora, PharmD, BCPS Heart Failure Stewardship Pharmacist Phone [phone]

## 2022-12-11 NOTE — Progress Notes (Signed)
Mobility Specialist - Progress Note   12/11/22 1147  Mobility  Activity Ambulated independently in hallway  Level of Assistance Independent  Assistive Device None  Distance Ambulated (ft) 400 ft  Activity Response Tolerated well  $Mobility charge 1 Mobility    Pt received on BSC agreeable to mobility. No complaints throughout, tolerated distance well. Left sitting EOB w/ all needs met.   [place] Specialist Please contact via SecureChat or Rehab office at [phone]

## 2022-12-11 NOTE — Progress Notes (Signed)
Heart Failure Nurse Navigator Progress Note   Patient has been consulted with Advanced Heart Failure Team. Her HF TOC appointment has been cancelled for 12/26/2022.   Navigator will sign off at this time.   Earnestine Leys, BSN, Clinical cytogeneticist Only

## 2022-12-11 NOTE — Consult Note (Signed)
Advanced Heart Failure Team Consult Note   Primary Physician: Pcp, No PCP-Cardiologist:  None  Reason for Consultation: Acute Systolic Heart Failure   HPI:    Ruth Edwards is seen today for evaluation of acute systolic heart failure at the request of Dr. Cathlean Sauer, Internal Medicine.   57 y/o female w/ h/o untreated HTN, Type 2DM and evidence of CAD on chest CT, admitted w/ new systolic heart failure. No routine medical care in recent years.   Reports development of new exertional dyspnea x 4 wks, progressively worse over the last 2 wks w/ progression to resting dyspnea, + orthopnea. Denies CP.   In ED found to be markedly hypertensive, 190s/120s. EKG NSR w/ LVH and PVCs. CXR w/ pulmonary edema. Chest CT negative for PE and dissection. Coronary calcifications noted. Hs trop 17. BNP 283. Na 133, K 3.4, Gluc 411, Scr 0.52, AST 43, ALT 26 Hgb A1c 12.3. WBC 11.2, Hgb 13.7.   Was admitted and started on IV Lasix and antihypertensive regimen. Echo was done showing reduced EF, 25-30%, + LV trabeculations. RV normal. AHF team consulted for further management.   Diuresing w/ IV Lasix w/ interval improvement of symptoms. BP remains elevated. SCr stable 0.61 today. Denies CP   Review of Systems: [y] = yes, [ ]$  = no   General: Weight gain [ ]$ ; Weight loss [ ]$ ; Anorexia [ ]$ ; Fatigue [ ]$ ; Fever [ ]$ ; Chills [ ]$ ; Weakness [ ]$   Cardiac: Chest pain/pressure [ ]$ ; Resting SOB Y ]; Exertional SOB [Y ]; Orthopnea [ Y]; Pedal Edema [ ]$ ; Palpitations [ ]$ ; Syncope [ ]$ ; Presyncope [ ]$ ; Paroxysmal nocturnal dyspnea[ ]$   Pulmonary: Cough [ ]$ ; Wheezing[ ]$ ; Hemoptysis[ ]$ ; Sputum [ ]$ ; Snoring [ ]$   GI: Vomiting[ ]$ ; Dysphagia[ ]$ ; Melena[ ]$ ; Hematochezia [ ]$ ; Heartburn[ ]$ ; Abdominal pain [ ]$ ; Constipation [ ]$ ; Diarrhea [ ]$ ; BRBPR [ ]$   GU: Hematuria[ ]$ ; Dysuria [ ]$ ; Nocturia[ ]$   Vascular: Pain in legs with walking [ ]$ ; Pain in feet with lying flat [ ]$ ; Non-healing sores [ ]$ ; Stroke [ ]$ ; TIA [ ]$ ; Slurred speech [  ];  Neuro: Headaches[ ]$ ; Vertigo[ ]$ ; Seizures[ ]$ ; Paresthesias[ ]$ ;Blurred vision [ ]$ ; Diplopia [ ]$ ; Vision changes [ ]$   Ortho/Skin: Arthritis [ ]$ ; Joint pain [ ]$ ; Muscle pain [ ]$ ; Joint swelling [ ]$ ; Back Pain [ ]$ ; Rash [ ]$   Psych: Depression[ ]$ ; Anxiety[ ]$   Heme: Bleeding problems [ ]$ ; Clotting disorders [ ]$ ; Anemia [ ]$   Endocrine: Diabetes [ Y]; Thyroid dysfunction[ ]$   Home Medications Prior to Admission medications   Not on File    Past Medical History: Past Medical History:  Diagnosis Date   DM2 (diabetes mellitus, type 2) ([place])    HTN (hypertension)     Past Surgical History: Past Surgical History:  Procedure Laterality Date   CESAREAN SECTION      Family History: History reviewed. No pertinent family history.  Social History: Social History   Socioeconomic History   Marital status: Married    Spouse name: IBrahim   Number of children: Not on file   Years of education: Not on file   Highest education level: Not on file  Occupational History   Not on file  Tobacco Use   Smoking status: Never   Smokeless tobacco: Never  Vaping Use   Vaping Use: Never used  Substance and Sexual Activity   Alcohol use: Never   Drug use: Never   Sexual activity: Yes  Other Topics Concern   Not on file  Social History Narrative   Not on file   Social Determinants of Health   Financial Resource Strain: High Risk (12/10/2022)   Overall Financial Resource Strain (CARDIA)    Difficulty of Paying Living Expenses: Hard  Food Insecurity: No Food Insecurity (12/10/2022)   Hunger Vital Sign    Worried About Running Out of Food in the Last Year: Never true    Ran Out of Food in the Last Year: Never true  Transportation Needs: No Transportation Needs (12/10/2022)   PRAPARE - Hydrologist (Medical): No    Lack of Transportation (Non-Medical): No  Physical Activity: Not on file  Stress: Not on file  Social Connections: Not on file    Allergies:  Not  on File  Objective:    Vital Signs:   Temp:  [98.2 F (36.8 C)-98.5 F (36.9 C)] 98.2 F (36.8 C) (02/13 0532) Pulse Rate:  [101-121] 109 (02/13 0532) Resp:  [16-18] 18 (02/13 0532) BP: (155-195)/(97-126) 161/101 (02/13 0532) SpO2:  [100 %] 100 % (02/13 0532) Weight:  [83.1 kg] 83.1 kg (02/13 0532) Last BM Date : 12/09/22  Weight change: Filed Weights   12/09/22 2031 12/10/22 0450 12/11/22 0532  Weight: 84.8 kg 84.3 kg 83.1 kg    Intake/Output:   Intake/Output Summary (Last 24 hours) at 12/11/2022 1143 Last data filed at 12/10/2022 2200 Gross per 24 hour  Intake 240 ml  Output 1000 ml  Net -760 ml      Physical Exam    General:  Well appearing. No resp difficulty HEENT: normal Neck: supple. JVP 10 cm . Carotids 2+ bilat; no bruits. No lymphadenopathy or thyromegaly appreciated. Cor: PMI nondisplaced. Regular rate & rhythm. No rubs, gallops or murmurs. Lungs: clear Abdomen: soft, nontender, nondistended. No hepatosplenomegaly. No bruits or masses. Good bowel sounds. Extremities: no cyanosis, clubbing, rash, 1+ b/l LE pretibial edema Neuro: alert & orientedx3, cranial nerves grossly intact. moves all 4 extremities w/o difficulty. Affect pleasant   Telemetry   NSR 80s   EKG    NSR w/ LVH and 1 PVC 79 bpm   Labs   Basic Metabolic Panel: Recent Labs  Lab 12/09/22 0237 12/10/22 0652 12/11/22 0043  NA 133* 138 139  K 3.4* 2.9* 3.6  CL 98 99 101  CO2 23 27 28  $ GLUCOSE 411* 250* 174*  BUN 9 14 15  $ CREATININE 0.52 0.69 0.61  CALCIUM 9.0 8.9 9.3  MG  --   --  1.8    Liver Function Tests: Recent Labs  Lab 12/09/22 0237  AST 43*  ALT 26  ALKPHOS 107  BILITOT 0.7  PROT 7.9  ALBUMIN 3.3*   No results for input(s): "LIPASE", "AMYLASE" in the last 168 hours. No results for input(s): "AMMONIA" in the last 168 hours.  CBC: Recent Labs  Lab 12/09/22 0237  WBC 11.2*  NEUTROABS 6.9  HGB 13.7  HCT 40.1  MCV 78.0*  PLT 203    Cardiac  Enzymes: No results for input(s): "CKTOTAL", "CKMB", "CKMBINDEX", "TROPONINI" in the last 168 hours.  BNP: BNP (last 3 results) Recent Labs    12/09/22 0237  BNP 283.5*    ProBNP (last 3 results) No results for input(s): "PROBNP" in the last 8760 hours.   CBG: Recent Labs  Lab 12/10/22 0621 12/10/22 1154 12/10/22 1608 12/10/22 2113 12/11/22 0601  GLUCAP 263* 159* 237* 145* 146*    Coagulation Studies: No results for  input(s): "LABPROT", "INR" in the last 72 hours.   Imaging   No results found.   Medications:     Current Medications:  empagliflozin  10 mg Oral Daily   feeding supplement (GLUCERNA SHAKE)  237 mL Oral TID BM   furosemide  60 mg Intravenous BID   insulin aspart  0-15 Units Subcutaneous TID WC   labetalol  20 mg Intravenous Once   sacubitril-valsartan  1 tablet Oral BID   sodium chloride flush  3 mL Intravenous Q12H   spironolactone  12.5 mg Oral Daily    Infusions:  sodium chloride        Patient Profile   56 y/o female w/ h/o untreated HTN, Type 2DM and evidence of CAD on chest CT, admitted w/ new systolic heart failure and uncontrolled HTN.   Assessment/Plan   1. Acute Systolic Heart Failure - New. Echo EF 25-30%, RV ok  - NYHA Class III, diuresing w/ IV Lasix but remains volume overloaded  - Etiology uncertain. W/ CAD noted on CT + risk factors, will need LHC to exclude ischemic CM - Plan [facility] tomorrow - Plan cMRI after cath (r/o noncompaction given LV trabeculations) - Continue IV Lasix 60 mg bid  - Continue Entresto 49-51 mg bid - Increase Spironolactone to 25 mg daily  - Continue Jardiance 10 mg daily  - wait to add ? blocker until fully diuresed  - eventual Bidil if BP remains elevated   2. Hypertension  - uncontrolled, 190s/120s on admit - GDMT titration per above  - outpatient sleep study to exclude OSA   3. CAD - coronary calcifications noted on chest CT - denies ischemic CP. Hs trop not c/w ACS - plan LHC this  admit to exclude ischemia as cause for HF - start statin. Check FLP in AM. LDL Goal < 70   4. Type 2DM  - new diagnosis, uncontrolled Hgb A1c 12.3 - insulin per primary team  - Jardiance added - start statin  - UA to screen for proteinuria  - will need to establish care w/ PCP post d/c   5. Hypokalemia - replete K w/ diuresis - cont Arlyce Harman + Delene Loll     Length of Stay: 2  Lyda Jester, PA-C  12/11/2022, 11:43 AM  Advanced Heart Failure Team Pager [phone] (M-F; 7a - 5p)  Please contact [facility] for night-coverage after hours (4p -7a ) and weekends on [email or web address]

## 2022-12-11 NOTE — H&P (View-Only) (Signed)
Advanced Heart Failure Team Consult Note   Primary Physician: Pcp, No PCP-Cardiologist:  None  Reason for Consultation: Acute Systolic Heart Failure   HPI:    Ruth Edwards is seen today for evaluation of acute systolic heart failure at the request of Dr. Cathlean Sauer, Internal Medicine.   57 y/o female w/ h/o untreated HTN, Type 2DM and evidence of CAD on chest CT, admitted w/ new systolic heart failure. No routine medical care in recent years.   Reports development of new exertional dyspnea x 4 wks, progressively worse over the last 2 wks w/ progression to resting dyspnea, + orthopnea. Denies CP.   In ED found to be markedly hypertensive, 190s/120s. EKG NSR w/ LVH and PVCs. CXR w/ pulmonary edema. Chest CT negative for PE and dissection. Coronary calcifications noted. Hs trop 17. BNP 283. Na 133, K 3.4, Gluc 411, Scr 0.52, AST 43, ALT 26 Hgb A1c 12.3. WBC 11.2, Hgb 13.7.   Was admitted and started on IV Lasix and antihypertensive regimen. Echo was done showing reduced EF, 25-30%, + LV trabeculations. RV normal. AHF team consulted for further management.   Diuresing w/ IV Lasix w/ interval improvement of symptoms. BP remains elevated. SCr stable 0.61 today. Denies CP   Review of Systems: [y] = yes, [ ]$  = no   General: Weight gain [ ]$ ; Weight loss [ ]$ ; Anorexia [ ]$ ; Fatigue [ ]$ ; Fever [ ]$ ; Chills [ ]$ ; Weakness [ ]$   Cardiac: Chest pain/pressure [ ]$ ; Resting SOB Y ]; Exertional SOB [Y ]; Orthopnea [ Y]; Pedal Edema [ ]$ ; Palpitations [ ]$ ; Syncope [ ]$ ; Presyncope [ ]$ ; Paroxysmal nocturnal dyspnea[ ]$   Pulmonary: Cough [ ]$ ; Wheezing[ ]$ ; Hemoptysis[ ]$ ; Sputum [ ]$ ; Snoring [ ]$   GI: Vomiting[ ]$ ; Dysphagia[ ]$ ; Melena[ ]$ ; Hematochezia [ ]$ ; Heartburn[ ]$ ; Abdominal pain [ ]$ ; Constipation [ ]$ ; Diarrhea [ ]$ ; BRBPR [ ]$   GU: Hematuria[ ]$ ; Dysuria [ ]$ ; Nocturia[ ]$   Vascular: Pain in legs with walking [ ]$ ; Pain in feet with lying flat [ ]$ ; Non-healing sores [ ]$ ; Stroke [ ]$ ; TIA [ ]$ ; Slurred speech [  ];  Neuro: Headaches[ ]$ ; Vertigo[ ]$ ; Seizures[ ]$ ; Paresthesias[ ]$ ;Blurred vision [ ]$ ; Diplopia [ ]$ ; Vision changes [ ]$   Ortho/Skin: Arthritis [ ]$ ; Joint pain [ ]$ ; Muscle pain [ ]$ ; Joint swelling [ ]$ ; Back Pain [ ]$ ; Rash [ ]$   Psych: Depression[ ]$ ; Anxiety[ ]$   Heme: Bleeding problems [ ]$ ; Clotting disorders [ ]$ ; Anemia [ ]$   Endocrine: Diabetes [ Y]; Thyroid dysfunction[ ]$   Home Medications Prior to Admission medications   Not on File    Past Medical History: Past Medical History:  Diagnosis Date   DM2 (diabetes mellitus, type 2) ([facility])    HTN (hypertension)     Past Surgical History: Past Surgical History:  Procedure Laterality Date   CESAREAN SECTION      Family History: History reviewed. No pertinent family history.  Social History: Social History   Socioeconomic History   Marital status: Married    Spouse name: IBrahim   Number of children: Not on file   Years of education: Not on file   Highest education level: Not on file  Occupational History   Not on file  Tobacco Use   Smoking status: Never   Smokeless tobacco: Never  Vaping Use   Vaping Use: Never used  Substance and Sexual Activity   Alcohol use: Never   Drug use: Never   Sexual activity: Yes  Other Topics Concern   Not on file  Social History Narrative   Not on file   Social Determinants of Health   Financial Resource Strain: High Risk (12/10/2022)   Overall Financial Resource Strain (CARDIA)    Difficulty of Paying Living Expenses: Hard  Food Insecurity: No Food Insecurity (12/10/2022)   Hunger Vital Sign    Worried About Running Out of Food in the Last Year: Never true    Ran Out of Food in the Last Year: Never true  Transportation Needs: No Transportation Needs (12/10/2022)   PRAPARE - Hydrologist (Medical): No    Lack of Transportation (Non-Medical): No  Physical Activity: Not on file  Stress: Not on file  Social Connections: Not on file    Allergies:  Not  on File  Objective:    Vital Signs:   Temp:  [98.2 F (36.8 C)-98.5 F (36.9 C)] 98.2 F (36.8 C) (02/13 0532) Pulse Rate:  [101-121] 109 (02/13 0532) Resp:  [16-18] 18 (02/13 0532) BP: (155-195)/(97-126) 161/101 (02/13 0532) SpO2:  [100 %] 100 % (02/13 0532) Weight:  [83.1 kg] 83.1 kg (02/13 0532) Last BM Date : 12/09/22  Weight change: Filed Weights   12/09/22 2031 12/10/22 0450 12/11/22 0532  Weight: 84.8 kg 84.3 kg 83.1 kg    Intake/Output:   Intake/Output Summary (Last 24 hours) at 12/11/2022 1143 Last data filed at 12/10/2022 2200 Gross per 24 hour  Intake 240 ml  Output 1000 ml  Net -760 ml      Physical Exam    General:  Well appearing. No resp difficulty HEENT: normal Neck: supple. JVP 10 cm . Carotids 2+ bilat; no bruits. No lymphadenopathy or thyromegaly appreciated. Cor: PMI nondisplaced. Regular rate & rhythm. No rubs, gallops or murmurs. Lungs: clear Abdomen: soft, nontender, nondistended. No hepatosplenomegaly. No bruits or masses. Good bowel sounds. Extremities: no cyanosis, clubbing, rash, 1+ b/l LE pretibial edema Neuro: alert & orientedx3, cranial nerves grossly intact. moves all 4 extremities w/o difficulty. Affect pleasant   Telemetry   NSR 80s   EKG    NSR w/ LVH and 1 PVC 79 bpm   Labs   Basic Metabolic Panel: Recent Labs  Lab 12/09/22 0237 12/10/22 0652 12/11/22 0043  NA 133* 138 139  K 3.4* 2.9* 3.6  CL 98 99 101  CO2 23 27 28  $ GLUCOSE 411* 250* 174*  BUN 9 14 15  $ CREATININE 0.52 0.69 0.61  CALCIUM 9.0 8.9 9.3  MG  --   --  1.8    Liver Function Tests: Recent Labs  Lab 12/09/22 0237  AST 43*  ALT 26  ALKPHOS 107  BILITOT 0.7  PROT 7.9  ALBUMIN 3.3*   No results for input(s): "LIPASE", "AMYLASE" in the last 168 hours. No results for input(s): "AMMONIA" in the last 168 hours.  CBC: Recent Labs  Lab 12/09/22 0237  WBC 11.2*  NEUTROABS 6.9  HGB 13.7  HCT 40.1  MCV 78.0*  PLT 203    Cardiac  Enzymes: No results for input(s): "CKTOTAL", "CKMB", "CKMBINDEX", "TROPONINI" in the last 168 hours.  BNP: BNP (last 3 results) Recent Labs    12/09/22 0237  BNP 283.5*    ProBNP (last 3 results) No results for input(s): "PROBNP" in the last 8760 hours.   CBG: Recent Labs  Lab 12/10/22 0621 12/10/22 1154 12/10/22 1608 12/10/22 2113 12/11/22 0601  GLUCAP 263* 159* 237* 145* 146*    Coagulation Studies: No results for  input(s): "LABPROT", "INR" in the last 72 hours.   Imaging   No results found.   Medications:     Current Medications:  empagliflozin  10 mg Oral Daily   feeding supplement (GLUCERNA SHAKE)  237 mL Oral TID BM   furosemide  60 mg Intravenous BID   insulin aspart  0-15 Units Subcutaneous TID WC   labetalol  20 mg Intravenous Once   sacubitril-valsartan  1 tablet Oral BID   sodium chloride flush  3 mL Intravenous Q12H   spironolactone  12.5 mg Oral Daily    Infusions:  sodium chloride        Patient Profile   57 y/o female w/ h/o untreated HTN, Type 2DM and evidence of CAD on chest CT, admitted w/ new systolic heart failure and uncontrolled HTN.   Assessment/Plan   1. Acute Systolic Heart Failure - New. Echo EF 25-30%, RV ok  - NYHA Class III, diuresing w/ IV Lasix but remains volume overloaded  - Etiology uncertain. W/ CAD noted on CT + risk factors, will need LHC to exclude ischemic CM - Plan [facility] tomorrow - Plan cMRI after cath (r/o noncompaction given LV trabeculations) - Continue IV Lasix 60 mg bid  - Continue Entresto 49-51 mg bid - Increase Spironolactone to 25 mg daily  - Continue Jardiance 10 mg daily  - wait to add ? blocker until fully diuresed  - eventual Bidil if BP remains elevated   2. Hypertension  - uncontrolled, 190s/120s on admit - GDMT titration per above  - outpatient sleep study to exclude OSA   3. CAD - coronary calcifications noted on chest CT - denies ischemic CP. Hs trop not c/w ACS - plan LHC this  admit to exclude ischemia as cause for HF - start statin. Check FLP in AM. LDL Goal < 70   4. Type 2DM  - new diagnosis, uncontrolled Hgb A1c 12.3 - insulin per primary team  - Jardiance added - start statin  - UA to screen for proteinuria  - will need to establish care w/ PCP post d/c   5. Hypokalemia - replete K w/ diuresis - cont Arlyce Harman + Delene Loll     Length of Stay: 2  Lyda Jester, PA-C  12/11/2022, 11:43 AM  Advanced Heart Failure Team Pager [phone] (M-F; 7a - 5p)  Please contact [facility] for night-coverage after hours (4p -7a ) and weekends on [email or web address]

## 2022-12-12 ENCOUNTER — Inpatient Hospital Stay (HOSPITAL_COMMUNITY): Payer: BLUE CROSS/BLUE SHIELD

## 2022-12-12 ENCOUNTER — Encounter (HOSPITAL_COMMUNITY)
Admission: EM | Disposition: A | Discharge status: Home / Self Care | Payer: Self-pay | Source: Home / Self Care | Attending: Internal Medicine

## 2022-12-12 DIAGNOSIS — I5023 Acute on chronic systolic (congestive) heart failure: Secondary | ICD-10-CM

## 2022-12-12 DIAGNOSIS — E785 Hyperlipidemia, unspecified: Secondary | ICD-10-CM

## 2022-12-12 HISTORY — PX: RIGHT/LEFT HEART CATH AND CORONARY ANGIOGRAPHY: CATH118266

## 2022-12-12 LAB — BASIC METABOLIC PANEL
Anion gap: 15 (ref 5–15)
BUN: 21 mg/dL — ABNORMAL HIGH (ref 6–20)
CO2: 24 mmol/L (ref 22–32)
Calcium: 9.7 mg/dL (ref 8.9–10.3)
Chloride: 99 mmol/L (ref 98–111)
Creatinine, Ser: 0.79 mg/dL (ref 0.44–1.00)
GFR, Estimated: >60 mL/min (ref >60)
Glucose, Bld: 198 mg/dL — ABNORMAL HIGH (ref 70–99)
Potassium: 3.9 mmol/L (ref 3.5–5.1)
Sodium: 138 mmol/L (ref 135–145)

## 2022-12-12 LAB — POCT I-STAT 7, (LYTES, BLD GAS, ICA,H+H)
Acid-base deficit: 2 mmol/L (ref 0.0–2.0)
Bicarbonate: 21 mmol/L (ref 20.0–28.0)
Calcium, Ion: 1.11 mmol/L — ABNORMAL LOW (ref 1.15–1.40)
HCT: 39 % (ref 36.0–46.0)
Hemoglobin: 13.3 g/dL (ref 12.0–15.0)
O2 Saturation: 97 %
Potassium: 3.5 mmol/L (ref 3.5–5.1)
Sodium: 145 mmol/L (ref 135–145)
TCO2: 22 mmol/L (ref 22–32)
pCO2 arterial: 31.3 mmHg — ABNORMAL LOW (ref 32–48)
pH, Arterial: 7.435 (ref 7.35–7.45)
pO2, Arterial: 85 mmHg (ref 83–108)

## 2022-12-12 LAB — CBC
HCT: 45 % (ref 36.0–46.0)
Hemoglobin: 14.8 g/dL (ref 12.0–15.0)
MCH: 25.8 pg — ABNORMAL LOW (ref 26.0–34.0)
MCHC: 32.9 g/dL (ref 30.0–36.0)
MCV: 78.4 fL — ABNORMAL LOW (ref 80.0–100.0)
Platelets: 235 10*3/uL (ref 150–400)
RBC: 5.74 MIL/uL — ABNORMAL HIGH (ref 3.87–5.11)
RDW: 13.7 % (ref 11.5–15.5)
WBC: 9.1 10*3/uL (ref 4.0–10.5)
nRBC: 0 % (ref 0.0–0.2)

## 2022-12-12 LAB — POCT I-STAT EG7
Acid-Base Excess: 2 mmol/L (ref 0.0–2.0)
Acid-Base Excess: 2 mmol/L (ref 0.0–2.0)
Bicarbonate: 28.5 mmol/L — ABNORMAL HIGH (ref 20.0–28.0)
Bicarbonate: 28.6 mmol/L — ABNORMAL HIGH (ref 20.0–28.0)
Calcium, Ion: 1.29 mmol/L (ref 1.15–1.40)
Calcium, Ion: 1.29 mmol/L (ref 1.15–1.40)
HCT: 44 % (ref 36.0–46.0)
HCT: 45 % (ref 36.0–46.0)
Hemoglobin: 15 g/dL (ref 12.0–15.0)
Hemoglobin: 15.3 g/dL — ABNORMAL HIGH (ref 12.0–15.0)
O2 Saturation: 48 %
O2 Saturation: 43 %
Potassium: 3.8 mmol/L (ref 3.5–5.1)
Potassium: 3.8 mmol/L (ref 3.5–5.1)
Sodium: 143 mmol/L (ref 135–145)
Sodium: 143 mmol/L (ref 135–145)
TCO2: 30 mmol/L (ref 22–32)
TCO2: 30 mmol/L (ref 22–32)
pCO2, Ven: 49.7 mmHg (ref 44–60)
pCO2, Ven: 49.3 mmHg (ref 44–60)
pH, Ven: 7.366 (ref 7.25–7.43)
pH, Ven: 7.372 (ref 7.25–7.43)
pO2, Ven: 27 mmHg — CL (ref 32–45)
pO2, Ven: 25 mmHg — CL (ref 32–45)

## 2022-12-12 LAB — GLUCOSE, CAPILLARY
Glucose-Capillary: 191 mg/dL — ABNORMAL HIGH (ref 70–99)
Glucose-Capillary: 141 mg/dL — ABNORMAL HIGH (ref 70–99)
Glucose-Capillary: 246 mg/dL — ABNORMAL HIGH (ref 70–99)
Glucose-Capillary: 267 mg/dL — ABNORMAL HIGH (ref 70–99)

## 2022-12-12 LAB — TSH: TSH: 2.022 u[IU]/mL (ref 0.350–4.500)

## 2022-12-12 LAB — LIPID PANEL
Cholesterol: 226 mg/dL — ABNORMAL HIGH (ref 0–200)
HDL: 60 mg/dL (ref >40)
LDL Cholesterol: 149 mg/dL — ABNORMAL HIGH (ref 0–99)
Total CHOL/HDL Ratio: 3.8 RATIO
Triglycerides: 86 mg/dL (ref <150)
VLDL: 17 mg/dL (ref 0–40)

## 2022-12-12 SURGERY — RIGHT/LEFT HEART CATH AND CORONARY ANGIOGRAPHY
Anesthesia: LOCAL

## 2022-12-12 MED ORDER — VERAPAMIL HCL 2.5 MG/ML IV SOLN
INTRAVENOUS | Status: DC | PRN
  Administered 2022-12-12: 10 mL via INTRA_ARTERIAL

## 2022-12-12 MED ORDER — HEPARIN (PORCINE) IN NACL 1000-0.9 UT/500ML-% IV SOLN
INTRAVENOUS | Status: DC | PRN
  Administered 2022-12-12 (×2): 500 mL

## 2022-12-12 MED ORDER — ASPIRIN 81 MG PO CHEW
81.0000 mg | CHEWABLE_TABLET | ORAL | Status: AC
  Administered 2022-12-12: 81 mg via ORAL
  Filled 2022-12-12: qty 1

## 2022-12-12 MED ORDER — SACUBITRIL-VALSARTAN 97-103 MG PO TABS
1.0000 | ORAL_TABLET | Freq: Two times a day (BID) | ORAL | Status: DC
  Administered 2022-12-12 – 2022-12-13 (×2): 1 via ORAL
  Filled 2022-12-12 (×2): qty 1

## 2022-12-12 MED ORDER — VERAPAMIL HCL 2.5 MG/ML IV SOLN
INTRAVENOUS | Status: AC
  Filled 2022-12-12: qty 2

## 2022-12-12 MED ORDER — SODIUM CHLORIDE 0.9% FLUSH: 3.0000 mL | INTRAVENOUS | Status: DC | PRN

## 2022-12-12 MED ORDER — FENTANYL CITRATE (PF) 100 MCG/2ML IJ SOLN
INTRAMUSCULAR | Status: AC
  Filled 2022-12-12: qty 2

## 2022-12-12 MED ORDER — SODIUM CHLORIDE 0.9 % IV SOLN: INTRAVENOUS | Status: DC

## 2022-12-12 MED ORDER — HEPARIN SODIUM (PORCINE) 1000 UNIT/ML IJ SOLN
INTRAMUSCULAR | Status: AC
  Filled 2022-12-12: qty 10

## 2022-12-12 MED ORDER — MIDAZOLAM HCL 2 MG/2ML IJ SOLN
INTRAMUSCULAR | Status: DC | PRN
  Administered 2022-12-12: 2 mg via INTRAVENOUS

## 2022-12-12 MED ORDER — IOHEXOL 350 MG/ML SOLN
INTRAVENOUS | Status: DC | PRN
  Administered 2022-12-12: 75 mL via INTRA_ARTERIAL

## 2022-12-12 MED ORDER — ASPIRIN 81 MG PO CHEW: 81.0000 mg | CHEWABLE_TABLET | ORAL | Status: DC

## 2022-12-12 MED ORDER — GADOBUTROL 1 MMOL/ML IV SOLN
8.0000 mL | Freq: Once | INTRAVENOUS | Status: AC | PRN
  Administered 2022-12-12: 8 mL via INTRAVENOUS

## 2022-12-12 MED ORDER — HEPARIN SODIUM (PORCINE) 1000 UNIT/ML IJ SOLN
INTRAMUSCULAR | Status: DC | PRN
  Administered 2022-12-12: 4000 [IU] via INTRAVENOUS

## 2022-12-12 MED ORDER — LIDOCAINE HCL (PF) 1 % IJ SOLN
INTRAMUSCULAR | Status: DC | PRN
  Administered 2022-12-12 (×3): 2 mL

## 2022-12-12 MED ORDER — LIDOCAINE HCL (PF) 1 % IJ SOLN
INTRAMUSCULAR | Status: AC
  Filled 2022-12-12: qty 30

## 2022-12-12 MED ORDER — SODIUM CHLORIDE 0.9 % IV SOLN: 250.0000 mL | INTRAVENOUS | Status: DC | PRN

## 2022-12-12 MED ORDER — SODIUM CHLORIDE 0.9 % IV SOLN: INTRAVENOUS | Status: AC

## 2022-12-12 MED ORDER — MIDAZOLAM HCL 2 MG/2ML IJ SOLN
INTRAMUSCULAR | Status: AC
  Filled 2022-12-12: qty 2

## 2022-12-12 MED ORDER — FENTANYL CITRATE (PF) 100 MCG/2ML IJ SOLN
INTRAMUSCULAR | Status: DC | PRN
  Administered 2022-12-12: 25 ug via INTRAVENOUS

## 2022-12-12 MED ORDER — LACTATED RINGERS IV BOLUS
400.0000 mL | Freq: Once | INTRAVENOUS | Status: AC
  Administered 2022-12-12: 400 mL via INTRAVENOUS

## 2022-12-12 MED ORDER — ATORVASTATIN CALCIUM 80 MG PO TABS
80.0000 mg | ORAL_TABLET | Freq: Every day | ORAL | Status: DC
  Administered 2022-12-13: 80 mg via ORAL
  Filled 2022-12-12: qty 1

## 2022-12-12 SURGICAL SUPPLY — 12 items
CATH BALLN WEDGE 5F 110CM (CATHETERS) IMPLANT
CATH INFINITI 5 FR JL3.5 (CATHETERS) IMPLANT
CATH INFINITI JR4 5F (CATHETERS) IMPLANT
DEVICE RAD COMP TR BAND LRG (VASCULAR PRODUCTS) IMPLANT
GLIDESHEATH SLEND A-KIT 6F 22G (SHEATH) IMPLANT
GLIDESHEATH SLEND SS 6F .021 (SHEATH) IMPLANT
GUIDEWIRE INQWIRE 1.5J.035X260 (WIRE) IMPLANT
INQWIRE 1.5J .035X260CM (WIRE) ×1
PACK CARDIAC CATHETERIZATION (CUSTOM PROCEDURE TRAY) ×1 IMPLANT
SHEATH GLIDE SLENDER 4/5FR (SHEATH) IMPLANT
SHEATH PROBE COVER 6X72 (BAG) IMPLANT
TRANSDUCER W/STOPCOCK (MISCELLANEOUS) ×1 IMPLANT

## 2022-12-12 NOTE — Progress Notes (Signed)
PROGRESS NOTE    Ruth Edwards  [id number] DOB: 28-Feb-1966 DOA: 12/09/2022 PCP: Pcp, No  56/F with hypertension who has not seen a doctor in decades presented to the ED with progressive dyspnea on exertion and edema, in the ER blood glucose was 411, troponin was 17, creatinine was 0.5, chest x-ray noted cardiomegaly and pulmonary vascular congestion, CT chest noted bilateral groundglass opacities. -Admitted started on diuretics, echo noted reduced LV function, EF of 25-30% with preserved RV   Subjective: -Feels better, just back from right heart cath  Assessment and Plan:  Acute systolic CHF Echo with EF 25 to 30%, global hypokinesis, RV systolic function preserved -Diuresed with IV Lasix, volume status has improved, 3.6 L negative  -Continue Jardiance, Entresto, Aldactone  -Right heart cath with low filling pressures, getting 500 mL overnight  -LHC, no CAD -Plan for cardiac MRI today  Hypertensive emergency. -Improved, meds as noted above  Hypokalemia -Replaced  DM2 (diabetes mellitus, type 2) ([facility]) Uncontrolled hyperglycemia.  New diagnosis -HbA1c is 12.3 -Continue Jardiance, add metformin tomorrow -Dietitian consult  Class 2 obesity Calculated BMI is 36.2    DVT prophylaxis: Lovenox Code Status: Full code Family Communication: Son at bedside Disposition Plan: Home likely tomorrow  Consultants:    Procedures:   Antimicrobials:    Objective: Vitals:   12/11/22 1927 12/12/22 0540 12/12/22 0841 12/12/22 0923  BP: (!) 174/112 (!) 154/107    Pulse: (!) 104 (!) 110    Resp: 18 18    Temp: 98.7 F (37.1 C) 98.9 F (37.2 C)    TempSrc: Oral Oral    SpO2:   94% 91%  Weight:      Height:        Intake/Output Summary (Last 24 hours) at 12/12/2022 1422 Last data filed at 12/12/2022 0545 Gross per 24 hour  Intake 240 ml  Output 600 ml  Net -360 ml   Filed Weights   12/09/22 2031 12/10/22 0450 12/11/22 0532  Weight: 84.8 kg 84.3 kg 83.1 kg     Examination:  General exam: Appears calm and comfortable  Respiratory system: Clear to auscultation Cardiovascular system: S1 & S2 heard, RRR.  Abd: nondistended, soft and nontender.Normal bowel sounds heard. Central nervous system: Alert and oriented. No focal neurological deficits. Extremities: no edema Skin: No rashes Psychiatry:  Mood & affect appropriate.     Data Reviewed:   CBC: Recent Labs  Lab 12/09/22 0237 12/12/22 0012  WBC 11.2* 9.1  NEUTROABS 6.9  --   HGB 13.7 14.8  HCT 40.1 45.0  MCV 78.0* 78.4*  PLT 203 [id number]   Basic Metabolic Panel: Recent Labs  Lab 12/09/22 0237 12/10/22 0652 12/11/22 0043 12/12/22 0012  NA 133* 138 139 138  K 3.4* 2.9* 3.6 3.9  CL 98 99 101 99  CO2 23 27 28 24  $ GLUCOSE 411* 250* 174* 198*  BUN 9 14 15 $ 21*  CREATININE 0.52 0.69 0.61 0.79  CALCIUM 9.0 8.9 9.3 9.7  MG  --   --  1.8  --    GFR: Estimated Creatinine Clearance: 73.2 mL/min (by C-G formula based on SCr of 0.79 mg/dL). Liver Function Tests: Recent Labs  Lab 12/09/22 0237  AST 43*  ALT 26  ALKPHOS 107  BILITOT 0.7  PROT 7.9  ALBUMIN 3.3*   No results for input(s): "LIPASE", "AMYLASE" in the last 168 hours. No results for input(s): "AMMONIA" in the last 168 hours. Coagulation Profile: No results for input(s): "INR", "PROTIME" in the last 168  hours. Cardiac Enzymes: No results for input(s): "CKTOTAL", "CKMB", "CKMBINDEX", "TROPONINI" in the last 168 hours. BNP (last 3 results) No results for input(s): "PROBNP" in the last 8760 hours. HbA1C: Recent Labs    12/09/22 2101  HGBA1C 12.3*   CBG: Recent Labs  Lab 12/11/22 1149 12/11/22 1621 12/11/22 2056 12/12/22 0549 12/12/22 1127  GLUCAP 201* 152* 156* 191* 141*   Lipid Profile: Recent Labs    12/12/22 0012  CHOL 226*  HDL 60  LDLCALC 149*  TRIG 86  CHOLHDL 3.8   Thyroid Function Tests: Recent Labs    12/12/22 0012  TSH 2.022   Anemia Panel: No results for input(s): "VITAMINB12",  "FOLATE", "FERRITIN", "TIBC", "IRON", "RETICCTPCT" in the last 72 hours. Urine analysis:    Component Value Date/Time   COLORURINE STRAW (A) 12/11/2022 1514   APPEARANCEUR CLEAR 12/11/2022 1514   LABSPEC 1.015 12/11/2022 1514   PHURINE 6.0 12/11/2022 1514   GLUCOSEU >=500 (A) 12/11/2022 1514   HGBUR NEGATIVE 12/11/2022 1514   BILIRUBINUR NEGATIVE 12/11/2022 1514   KETONESUR 20 (A) 12/11/2022 1514   PROTEINUR NEGATIVE 12/11/2022 1514   NITRITE NEGATIVE 12/11/2022 1514   LEUKOCYTESUR NEGATIVE 12/11/2022 1514   Sepsis Labs: @LABRCNTIP$ (procalcitonin:4,lacticidven:4)  )No results found for this or any previous visit (from the past 240 hour(s)).   Radiology Studies: CARDIAC CATHETERIZATION  Result Date: 12/12/2022 HEMODYNAMICS: RA:   2 mmHg (mean) RV:   16/2 mmHg PA:   23/8 mmHg (15 mean) PCWP:  3 mmHg (mean)    Estimated Fick CO/CI   2.3 L/min, 1.3 L/min/m2    TPG    12  mmHg     PVR     5.2 Wood Units PAPi      7.5  IMPRESSION: 1.Low pre and post capillary filling pressures 2.Elevated PVR secondary to low cardiac output. 3.Severely reduced cardiac output secondary to acute systolic heart failure & hypovolemia. 4. Right dominant coronary circulation with no coronary artery disease RECOMMENDATIONS: - 500cc IVF - Continue HFrEF GDMT Aditya Sabharwal Advanced Heart Failure 9:52 AM     Scheduled Meds:  atorvastatin  80 mg Oral Daily   carvedilol  3.125 mg Oral BID WC   empagliflozin  10 mg Oral Daily   enoxaparin (LOVENOX) injection  40 mg Subcutaneous Q24H   feeding supplement (GLUCERNA SHAKE)  237 mL Oral TID BM   insulin aspart  0-15 Units Subcutaneous TID WC   sacubitril-valsartan  1 tablet Oral BID   sodium chloride flush  3 mL Intravenous Q12H   sodium chloride flush  3 mL Intravenous Q12H   spironolactone  25 mg Oral Daily   Continuous Infusions:  sodium chloride       LOS: 3 days    Time spent: 79mn    PDomenic Polite MD [facility]   12/12/2022, 2:22 PM

## 2022-12-12 NOTE — Progress Notes (Signed)
Patient returned from Cath lab. Assessment of Right radial insertion site showed no signs or symptoms of bleeding. Band is in place and intact.

## 2022-12-12 NOTE — Progress Notes (Signed)
PT in cath lab unable to do mobility this AM due to off the unit will attempt later today

## 2022-12-12 NOTE — Progress Notes (Signed)
Advanced Heart Failure Rounding Note  PCP-Cardiologist: None   Subjective:    1.6L in UOP yesterday w/ IV Lasix. SCr stable 0.79. K 3.9   BPs remains elevated. Breathing improved. Going for [facility] today  Objective:   Weight Range: 83.1 kg Body mass index is 35.78 kg/m.   Vital Signs:   Temp:  [98.7 F (37.1 C)-99.7 F (37.6 C)] 98.9 F (37.2 C) (02/14 0540) Pulse Rate:  [104-128] 110 (02/14 0540) Resp:  [18] 18 (02/14 0540) BP: (154-183)/(107-126) 154/107 (02/14 0540) SpO2:  [94 %-100 %] 94 % (02/14 0841) Last BM Date : 12/09/22  Weight change: Filed Weights   12/09/22 2031 12/10/22 0450 12/11/22 0532  Weight: 84.8 kg 84.3 kg 83.1 kg    Intake/Output:   Intake/Output Summary (Last 24 hours) at 12/12/2022 0859 Last data filed at 12/12/2022 0545 Gross per 24 hour  Intake 284.31 ml  Output 1600 ml  Net -1315.69 ml      Physical Exam    General:  Well appearing. No resp difficulty HEENT: Normal Neck: Supple. JVP . Carotids 2+ bilat; no bruits. No lymphadenopathy or thyromegaly appreciated. Cor: PMI nondisplaced. Regular rate & rhythm. No rubs, gallops or murmurs. Lungs: Clear Abdomen: Soft, nontender, nondistended. No hepatosplenomegaly. No bruits or masses. Good bowel sounds. Extremities: No cyanosis, clubbing, rash, edema Neuro: Alert & orientedx3, cranial nerves grossly intact. moves all 4 extremities w/o difficulty. Affect pleasant   Telemetry   Sinus tach low 100s-110s, PVCs   EKG    N/A   Labs    CBC Recent Labs    12/12/22 0012  WBC 9.1  HGB 14.8  HCT 45.0  MCV 78.4*  PLT [id number]   Basic Metabolic Panel Recent Labs    12/11/22 0043 12/12/22 0012  NA 139 138  K 3.6 3.9  CL 101 99  CO2 28 24  GLUCOSE 174* 198*  BUN 15 21*  CREATININE 0.61 0.79  CALCIUM 9.3 9.7  MG 1.8  --    Liver Function Tests No results for input(s): "AST", "ALT", "ALKPHOS", "BILITOT", "PROT", "ALBUMIN" in the last 72 hours. No results for input(s):  "LIPASE", "AMYLASE" in the last 72 hours. Cardiac Enzymes No results for input(s): "CKTOTAL", "CKMB", "CKMBINDEX", "TROPONINI" in the last 72 hours.  BNP: BNP (last 3 results) Recent Labs    12/09/22 0237  BNP 283.5*    ProBNP (last 3 results) No results for input(s): "PROBNP" in the last 8760 hours.   D-Dimer No results for input(s): "DDIMER" in the last 72 hours. Hemoglobin A1C Recent Labs    12/09/22 2101  HGBA1C 12.3*   Fasting Lipid Panel Recent Labs    12/12/22 0012  CHOL 226*  HDL 60  LDLCALC 149*  TRIG 86  CHOLHDL 3.8   Thyroid Function Tests Recent Labs    12/12/22 0012  TSH 2.022    Other results:   Imaging    No results found.   Medications:     Scheduled Medications:  [MAR Hold] atorvastatin  80 mg Oral Daily   [MAR Hold] carvedilol  3.125 mg Oral BID WC   [MAR Hold] empagliflozin  10 mg Oral Daily   [MAR Hold] enoxaparin (LOVENOX) injection  40 mg Subcutaneous Q24H   [MAR Hold] feeding supplement (GLUCERNA SHAKE)  237 mL Oral TID BM   [MAR Hold] furosemide  60 mg Intravenous BID   [MAR Hold] insulin aspart  0-15 Units Subcutaneous TID WC   [MAR Hold] sacubitril-valsartan  1 tablet Oral  BID   [MAR Hold] sodium chloride flush  3 mL Intravenous Q12H   [MAR Hold] sodium chloride flush  3 mL Intravenous Q12H   [MAR Hold] spironolactone  25 mg Oral Daily    Infusions:  [MAR Hold] sodium chloride     sodium chloride     sodium chloride 10 mL/hr at 12/12/22 0657    PRN Medications: [MAR Hold] sodium chloride, sodium chloride, [MAR Hold] acetaminophen, [MAR Hold] ondansetron (ZOFRAN) IV, [MAR Hold] sodium chloride flush, sodium chloride flush    Patient Profile   57 y/o female w/ h/o untreated HTN, Type 2DM and evidence of CAD on chest CT, admitted w/ new systolic heart failure and uncontrolled HTN.  EF 25-30%, RV ok   Assessment/Plan   1. Acute Systolic Heart Failure - New. Echo EF 25-30%, RV ok  - NYHA Class III, diuresing w/  IV Lasix but remains volume overloaded  - Etiology uncertain. W/ CAD noted on CT + risk factors, need to exclude ischemic CM - Plan R/LHC today  - Plan cMRI after cath (r/o noncompaction given LV trabeculations) - RHC to guide diuresis   - Increase Entresto to 97-103 mg bid - Continue Spironolactone 25 mg daily  - Continue Jardiance 10 mg daily  - Continue Coreg 3.125. Further titrate if output ok on RHC  - eventual Bidil if BP remains elevated    2. Hypertension  - uncontrolled, 190s/120s on admit - GDMT titration per above  - outpatient sleep study to exclude OSA    3. CAD - coronary calcifications noted on chest CT - denies ischemic CP. Hs trop not c/w ACS - plan LHC today to exclude ischemia as cause for HF    4. Type 2DM  - new diagnosis, uncontrolled Hgb A1c 12.3 - insulin per primary team  - Jardiance added - start statin  - UA - proteinuria  - will need to establish care w/ PCP post d/c    5. Hypokalemia - 3.9 today  - continue K supp w/ diuresis - cont Arlyce Harman + Entresto   6. HLD - LDL elevated 149. LDL Goal < 70  - statin added, atorva 40 mg - will need FLP and HFTs in 6 wks   Length of Stay: 3  Taliyah Watrous, PA-C  12/12/2022, 8:59 AM  Advanced Heart Failure Team Pager [phone] (M-F; 7a - 5p)  Please contact [facility] for night-coverage after hours (5p -7a ) and weekends on [email or web address]

## 2022-12-12 NOTE — Inpatient Diabetes Management (Signed)
Inpatient Diabetes Program Recommendations  AACE/ADA: New Consensus Statement on Inpatient Glycemic Control (2015)  Target Ranges:  Prepandial:   less than 140 mg/dL      Peak postprandial:   less than 180 mg/dL (1-2 hours)      Critically ill patients:  140 - 180 mg/dL   Lab Results  Component Value Date   GLUCAP 191 (H) 12/12/2022   HGBA1C 12.3 (H) 12/09/2022    Review of Glycemic Control  Diabetes history: New-onset DM2 Outpatient Diabetes medications: None Current orders for Inpatient glycemic control: Novolog 0-15 TID, Jardiance 10 mg QD  HgbA1C - 12.3% Only required 10 units of Novolog s/s yesterday  Spoke with pt about survival skills, diet, importance of exercise and monitoring blood sugars. Pt states someone is helping with obtaining PCP for diabetes management. Pt states she has insurance, although listed as "no insurance." Pt and son are hoping to be discharged on pills for diabetes, along with lifestyle modification of weight loss, healthy eating, and physical activity. Discussed glucose monitoring with pt and son. Asked nursing staff to allow pt to stick finger for CBG checks.   Inpatient Diabetes Program Recommendations:    For discharge:  Metformin 500 mg BID Jardiance 10 mg QD Blood glucose meter kit [id number])  Needs PCP for management of diabetes.  Thank you. Lorenda Peck, RD, LDN, [facility] Inpatient Diabetes Coordinator [phone]

## 2022-12-12 NOTE — Progress Notes (Signed)
The radial band has been deflated and removed with no signs or symptoms of bleeding including hematoma. Patient vitals are stable with no complaints at this time. Will continue to monitor entry site throughout the shift.

## 2022-12-12 NOTE — Plan of Care (Signed)
  Problem: Coping: Goal: Ability to adjust to condition or change in health will improve Outcome: Progressing   Problem: Education: Goal: Knowledge of General Education information will improve Description: Including pain rating scale, medication(s)/side effects and non-pharmacologic comfort measures Outcome: Progressing   Problem: Activity: Goal: Risk for activity intolerance will decrease Outcome: Progressing

## 2022-12-12 NOTE — Interval H&P Note (Signed)
History and Physical Interval Note:  12/12/2022 8:34 AM  Ruth Edwards  has presented today for surgery, with the diagnosis of heart failure.  The various methods of treatment have been discussed with the patient and family. After consideration of risks, benefits and other options for treatment, the patient has consented to  Procedure(s): RIGHT/LEFT HEART CATH AND CORONARY ANGIOGRAPHY (N/A) as a surgical intervention.  The patient's history has been reviewed, patient examined, no change in status, stable for surgery.  I have reviewed the patient's chart and labs.  Questions were answered to the patient's satisfaction.     Yaslyn Cumby

## 2022-12-13 ENCOUNTER — Encounter (HOSPITAL_COMMUNITY): Payer: Self-pay | Admitting: Cardiology

## 2022-12-13 ENCOUNTER — Other Ambulatory Visit (HOSPITAL_COMMUNITY): Payer: Self-pay

## 2022-12-13 LAB — CBC
HCT: 41.7 % (ref 36.0–46.0)
Hemoglobin: 14.2 g/dL (ref 12.0–15.0)
MCH: 26.2 pg (ref 26.0–34.0)
MCHC: 34.1 g/dL (ref 30.0–36.0)
MCV: 77.1 fL — ABNORMAL LOW (ref 80.0–100.0)
Platelets: 265 10*3/uL (ref 150–400)
RBC: 5.41 MIL/uL — ABNORMAL HIGH (ref 3.87–5.11)
RDW: 13.6 % (ref 11.5–15.5)
WBC: 8.7 10*3/uL (ref 4.0–10.5)
nRBC: 0 % (ref 0.0–0.2)

## 2022-12-13 LAB — BASIC METABOLIC PANEL
Anion gap: 12 (ref 5–15)
BUN: 31 mg/dL — ABNORMAL HIGH (ref 6–20)
CO2: 25 mmol/L (ref 22–32)
Calcium: 9.5 mg/dL (ref 8.9–10.3)
Chloride: 101 mmol/L (ref 98–111)
Creatinine, Ser: 0.8 mg/dL (ref 0.44–1.00)
GFR, Estimated: >60 mL/min (ref >60)
Glucose, Bld: 240 mg/dL — ABNORMAL HIGH (ref 70–99)
Potassium: 3.8 mmol/L (ref 3.5–5.1)
Sodium: 138 mmol/L (ref 135–145)

## 2022-12-13 LAB — GLUCOSE, CAPILLARY
Glucose-Capillary: 228 mg/dL — ABNORMAL HIGH (ref 70–99)
Glucose-Capillary: 231 mg/dL — ABNORMAL HIGH (ref 70–99)

## 2022-12-13 MED ORDER — METFORMIN HCL 500 MG PO TABS
500.0000 mg | ORAL_TABLET | Freq: Two times a day (BID) | ORAL | 0 refills | Status: DC
  Filled 2022-12-13: qty 60, 30d supply, fill #0

## 2022-12-13 MED ORDER — SACUBITRIL-VALSARTAN 97-103 MG PO TABS
1.0000 | ORAL_TABLET | Freq: Two times a day (BID) | ORAL | 0 refills | Status: DC
  Filled 2022-12-13: qty 60, 30d supply, fill #0

## 2022-12-13 MED ORDER — BLOOD GLUCOSE METER KIT: PACK | 0 refills | Status: AC

## 2022-12-13 MED ORDER — ACCU-CHEK SOFTCLIX LANCETS MISC
0 refills | Status: AC
  Filled 2022-12-13: qty 100, 30d supply, fill #0

## 2022-12-13 MED ORDER — ACCU-CHEK GUIDE W/DEVICE KIT
PACK | 0 refills | Status: AC
  Filled 2022-12-13: qty 1, 30d supply, fill #0

## 2022-12-13 MED ORDER — ISOSORB DINITRATE-HYDRALAZINE 20-37.5 MG PO TABS
0.5000 | ORAL_TABLET | Freq: Three times a day (TID) | ORAL | 0 refills | Status: DC
  Filled 2022-12-13: qty 45, 30d supply, fill #0

## 2022-12-13 MED ORDER — ATORVASTATIN CALCIUM 80 MG PO TABS
80.0000 mg | ORAL_TABLET | Freq: Every day | ORAL | 0 refills | Status: DC
  Filled 2022-12-13: qty 30, 30d supply, fill #0

## 2022-12-13 MED ORDER — ISOSORB DINITRATE-HYDRALAZINE 20-37.5 MG PO TABS: 0.5000 | ORAL_TABLET | Freq: Three times a day (TID) | ORAL | Status: DC

## 2022-12-13 MED ORDER — EMPAGLIFLOZIN 10 MG PO TABS
10.0000 mg | ORAL_TABLET | Freq: Every day | ORAL | 0 refills | Status: DC
  Filled 2022-12-13: qty 30, 30d supply, fill #0

## 2022-12-13 MED ORDER — GLUCOSE BLOOD VI STRP
ORAL_STRIP | 0 refills | Status: AC
  Filled 2022-12-13: qty 100, 30d supply, fill #0

## 2022-12-13 MED ORDER — CARVEDILOL 3.125 MG PO TABS
3.1250 mg | ORAL_TABLET | Freq: Two times a day (BID) | ORAL | 0 refills | Status: DC
  Filled 2022-12-13: qty 60, 30d supply, fill #0

## 2022-12-13 MED ORDER — SPIRONOLACTONE 25 MG PO TABS
25.0000 mg | ORAL_TABLET | Freq: Every day | ORAL | 0 refills | Status: DC
  Filled 2022-12-13: qty 30, 30d supply, fill #0

## 2022-12-13 NOTE — Progress Notes (Addendum)
Advanced Heart Failure Rounding Note  PCP-Cardiologist: None   Subjective:    Feels good this morning, son at bedside. Denies CP/SOB. Ambulated in the halls yesterday, no complaints.   Objective:   L/RHC yesterday w/ severely reduced cardiac index likely secondary to hypovolemia (very low filling pressures). Received 443m IVF. No CAD  cMRI yesterday: LVEF 27%, RVEF 52%, Subendocardial LGE in apical lateral wall suggesting small infarct. Alternatively, sarcoidosis can also cause subendocardial LGE (though sarcoid tends to be multifocal)  Weight Range: 83.2 kg Body mass index is 35.84 kg/m.   Vital Signs:   Temp:  [97.8 F (36.6 C)-98.6 F (37 C)] 97.8 F (36.6 C) (02/15 0634) Pulse Rate:  [90-101] 92 (02/15 0634) Resp:  [18] 18 (02/15 0634) BP: (131-156)/(89-110) 143/100 (02/15 0634) SpO2:  [91 %-96 %] 96 % (02/15 0634) Weight:  [83.2 kg] 83.2 kg (02/15 0210) Last BM Date :  (PTA)  Weight change: Filed Weights   12/10/22 0450 12/11/22 0532 12/13/22 0210  Weight: 84.3 kg 83.1 kg 83.2 kg   Intake/Output:   Intake/Output Summary (Last 24 hours) at 12/13/2022 0730 Last data filed at 12/13/2022 0100 Gross per 24 hour  Intake 360 ml  Output 500 ml  Net -140 ml    Physical Exam    General:  well appearing.  No respiratory difficulty HEENT: normal Neck: supple. JVD ~6 cm. Carotids 2+ bilat; no bruits. No lymphadenopathy or thyromegaly appreciated. Cor: PMI nondisplaced. Regular rate & rhythm. No rubs, gallops or murmurs. Lungs: clear Abdomen: soft, nontender, nondistended. No hepatosplenomegaly. No bruits or masses. Good bowel sounds. Extremities: no cyanosis, clubbing, rash, edema  Neuro: alert & oriented x 3, cranial nerves grossly intact. moves all 4 extremities w/o difficulty. Affect pleasant.  Telemetry   NSR 90s (Personally reviewed)    EKG  No new EKG to review Labs    CBC Recent Labs    12/12/22 0012 12/12/22 0911 12/12/22 0924 12/13/22 0125   WBC 9.1  --   --  8.7  HGB 14.8   < > 13.3 14.2  HCT 45.0   < > 39.0 41.7  MCV 78.4*  --   --  77.1*  PLT 235  --   --  265   < > = values in this interval not displayed.   Basic Metabolic Panel Recent Labs    12/11/22 0043 12/12/22 0012 12/12/22 0911 12/12/22 0924 12/13/22 0125  NA 139 138   < > 145 138  K 3.6 3.9   < > 3.5 3.8  CL 101 99  --   --  101  CO2 28 24  --   --  25  GLUCOSE 174* 198*  --   --  240*  BUN 15 21*  --   --  31*  CREATININE 0.61 0.79  --   --  0.80  CALCIUM 9.3 9.7  --   --  9.5  MG 1.8  --   --   --   --    < > = values in this interval not displayed.   BNP: BNP (last 3 results) Recent Labs    12/09/22 0237  BNP 283.5*   Fasting Lipid Panel Recent Labs    12/12/22 0012  CHOL 226*  HDL 60  LDLCALC 149*  TRIG 86  CHOLHDL 3.8   Thyroid Function Tests Recent Labs    12/12/22 0012  TSH 2.022   Other results:  Imaging  MR CARDIAC VELOCITY FLOW MAP Result  Date: 12/12/2022 CLINICAL DATA:  41F with HTN, T2DM p/w new heart failure (EF 25-30% on echo). Normal coronary arteries on cath EXAM: CARDIAC MRI TECHNIQUE: The patient was scanned on a 1.5 Tesla Siemens magnet. A dedicated cardiac coil was used. Functional imaging was done using Fiesta sequences. 2,3, and 4 chamber views were done to assess for RWMA's. Modified Simpson's rule using a short axis stack was used to calculate an ejection fraction on a dedicated work Conservation officer, nature. The patient received 8 cc of Gadavist. After 10 minutes inversion recovery sequences were used to assess for infiltration and scar tissue. Phase contrast velocity mapping was performed above the aortic and pulmonic valves CONTRAST:  8 cc  of Gadavist FINDINGS: Left ventricle: -Mild hypertrophy -Normal size -Severe systolic dysfunction -Normal ECV (28%) and normal T2 values -Subendocardial LGE in apical lateral wall suggesting small infarct. Alternatively, sarcoidosis can also cause subendocardial LGE  (though sarcoid tends to be multifocal) LV EF:  27% (Normal 52-79%) Absolute volumes: LV EDV: 16m (Normal 78-167 mL) LV ESV: 1120m(Normal 21-64 mL) LV SV: 4226mNormal 52-114 mL) CO: 4.6L/min (Normal 2.7-6.3 L/min) Indexed volumes: LV EDV: 54m66m-m (Normal 50-96 mL/sq-m) LV ESV: 60mL79mm (Normal 10-40 mL/sq-m) LV SV: 22mL/38m (Normal 33-64 mL/sq-m) CI: 2.5L/min/sq-m (Normal 1.9-3.9 L/min/sq-m) Right ventricle: Small size with normal systolic function RV EF: 52% ([id number] 52-80%) Absolute volumes: RV EDV: 79mL (77mal 79-175 mL) RV ESV: 38mL (N2ml 13-75 mL) RV SV: 41mL (No54m 56-110 mL) CO: 4.6L/min (Normal 2.7-6 L/min) Indexed volumes: RV EDV: 42mL/sq-m47mrmal 51-97 mL/sq-m) RV ESV: 20mL/sq-m 18mmal 9-42 mL/sq-m) RV SV: 22mL/sq-m (60mal 35-61 mL/sq-m) CI: 2.4L/min/sq-m (Normal 1.8-3.8 L/min/sq-m) Left atrium: Mild enlargement Right atrium: Normal size Mitral valve: No regurgitation Aortic valve: Mild regurgitation Tricuspid valve: Mild regurgitation Pulmonic valve: Trivial regurgitation Aorta: Normal proximal ascending aorta Pericardium: Small effusion IMPRESSION: 1. Normal LV size, mild hypertrophy, and severe systolic dysfunction (EF 27%) 2.  [id number] size with normal systolic function (EF 52%) 3. [id number] LGE in apical lateral wall suggesting small infarct. Alternatively, sarcoidosis can also cause subendocardial LGE (though sarcoid tends to be multifocal) Electronically Signed   By: Christopher Oswaldo Milian02/14/2024 23:02   CARDIAC CATHETERIZATION Result Date: 12/12/2022 HEMODYNAMICS: RA:   2 mmHg (mean) RV:   16/2 mmHg PA:   23/8 mmHg (15 mean) PCWP:  3 mmHg (mean)    Estimated Fick CO/CI   2.3 L/min, 1.3 L/min/m2    TPG    12  mmHg     PVR     5.2 Wood Units PAPi      7.5  IMPRESSION: 1.Low pre and post capillary filling pressures 2.Elevated PVR secondary to low cardiac output. 3.Severely reduced cardiac output secondary to acute systolic heart failure & hypovolemia. 4. Right  dominant coronary circulation with no coronary artery disease RECOMMENDATIONS: - 500cc IVF - Continue HFrEF GDMT Aditya Sabharwal Advanced Heart Failure 9:52 AM     Medications:   Scheduled Medications:  atorvastatin  80 mg Oral Daily   carvedilol  3.125 mg Oral BID WC   empagliflozin  10 mg Oral Daily   enoxaparin (LOVENOX) injection  40 mg Subcutaneous Q24H   feeding supplement (GLUCERNA SHAKE)  237 mL Oral TID BM   insulin aspart  0-15 Units Subcutaneous TID WC   sacubitril-valsartan  1 tablet Oral BID   sodium chloride flush  3 mL Intravenous Q12H   sodium chloride flush  3 mL Intravenous Q12H   spironolactone  25  mg Oral Daily   Infusions:  sodium chloride     PRN Medications: sodium chloride, acetaminophen, ondansetron (ZOFRAN) IV, sodium chloride flush  Patient Profile  57 y/o female w/ h/o untreated HTN, Type 2DM and evidence of CAD on chest CT, admitted w/ new systolic heart failure and uncontrolled HTN.  EF 25-30%, RV ok  Assessment/Plan  1. Acute Systolic Heart Failure - New. Echo EF 25-30%, RV ok  - NYHA Class III, diuresed well with IV lasix.  - Etiology uncertain. W/ CAD noted on CT + risk factors, need to exclude ischemic CM. No CAD on LHC - RHC yesterday w/ severely reduced cardiac index likely secondary to hypovolemia (very low filling pressures). Received 437m IVF - cMRI yesterday: LVEF 27%, RVEF 52%, Subendocardial LGE in apical lateral wall suggesting small infarct. Alternatively, sarcoidosis can also cause subendocardial LGE (though sarcoid tends to be multifocal) - Continue Entresto  97-103 mg bid - Continue Spironolactone 25 mg daily  - Continue Jardiance 10 mg daily  - Continue Coreg 3.125. Further titrate if output ok on RHC, for now too low to uptitrate - Start half tab Bidil   2. Hypertension  - uncontrolled, 190s/120s on admit - GDMT titration per above  - outpatient sleep study to exclude OSA    3. CAD - coronary calcifications noted on chest  CT - denies ischemic CP. Hs trop not c/w ACS - LHC yesterday, no CAD  4. Type 2DM  - new diagnosis, uncontrolled Hgb A1c 12.3 - insulin per primary team  - Continue Jardiance - Continue statin  - UA - proteinuria  - will need to establish care w/ PCP post d/c    5. Hypokalemia - 3.8 today  - continue K supp w/ diuresis - cont SArlyce Harman+ Entresto   6. HLD - LDL elevated 149. LDL Goal < 70  - statin added, atorva 40 mg - will need FLP and HFTs in 6 wks  Appears stable for discharge from AHF standpoint pending MD eval today. Will schedule f/u in AHF clinic.  AHF meds at d/c: Atorvastatin 80 mg daily Bidil half tab  Carvedilol 3.125 mg BID Jardiance 10 mg daily Entresto 97-103 mg BID Spironolactone 25 mg daily  Length of Stay: 4[place] NP  12/13/2022, 7:30 AM  Advanced Heart Failure Team Pager [phone](M-F; 7a - 5p)  Please contact [email or web address] for night-coverage after hours (5p -7a ) and weekends on [email or web address]

## 2022-12-13 NOTE — TOC Benefit Eligibility Note (Signed)
Patient Ruth Edwards, English as a foreign language completed.    The patient is currently admitted and upon discharge could be taking isosorbide-hydrazine (Bidil) 20-37.5 mg.  The current 30 day co-pay is $0.00.   The patient is insured through [employer] [place], [place] Patient [place] Patient Advocate Team Direct Number: [phone]  Fax: [phone]

## 2022-12-13 NOTE — TOC CM/SW Note (Signed)
HF TOC CM spoke to pt and son at bedside. Pt has insurance. Made copy of card and faxed to admitting. Pt has scale at home for daily weights. Arranged PCP appt for pt. [place], Heart Failure TOC CM [phone]

## 2022-12-13 NOTE — Discharge Summary (Signed)
Physician Discharge Summary  Ruth Edwards [id number] DOB: Aug 16, 1966 DOA: 12/09/2022  PCP: Pcp, No  Admit date: 12/09/2022 Discharge date: 12/13/2022  Time spent: 45 minutes  Recommendations for Outpatient Follow-up:  CHF team in 2 weeks PCP in 1 week   Discharge Diagnoses:  Principal Problem:   Acute on chronic systolic CHF (congestive heart failure) (Ruth Edwards) Active Problems:   Essential hypertension   Hypokalemia   Uncontrolled type 2 diabetes mellitus with hyperglycemia, without long-term current use of insulin (Ruth Edwards)   Class 2 obesity   Heart failure ([place])   Discharge Condition: Improved  Diet recommendation: Low-sodium, diabetic  Filed Weights   12/10/22 0450 12/11/22 0532 12/13/22 0210  Weight: 84.3 kg 83.1 kg 83.2 kg    History of present illness:  57/F with hypertension who has not seen a doctor in decades presented to the ED with progressive dyspnea on exertion and edema, in the ER blood glucose was 411, troponin was 17, creatinine was 0.5, chest x-ray noted cardiomegaly and pulmonary vascular congestion, CT chest noted bilateral groundglass opacities. -Admitted started on diuretics, echo noted reduced LV function, EF of 25-30% with preserved RV  Hospital Course:   Acute systolic CHF Echo with EF 25 to 30%, global hypokinesis, RV systolic function preserved -Diuresed with IV Lasix, volume status has improved, 4.3 L negative  -Right heart cath with low filling pressures, getting 500 mL overnight  -LHC, no CAD -Cardiac MRI completed yesterday with a EF of 27%, subendocardial LGE in apical lateral wall suggesting small infarct, per cards this is likely hypertensive cardiomyopathy -Now transitioned to Entresto, Aldactone, Jardiance, Coreg and half a tab of BiDil daily   Hypertensive emergency. -Improved, meds as noted above   Hypokalemia -Replaced   DM2 (diabetes mellitus, type 2) (Ruth Edwards) Uncontrolled hyperglycemia.  New diagnosis -HbA1c is  12.3 -Continue Jardiance, started metformin at this -Dietitian consulted, weight loss recommended   Class 2 obesity Calculated BMI is 36.2   Discharge Exam: Vitals:   12/13/22 0210 12/13/22 0634  BP: (!) 147/102 (!) 143/100  Pulse: 90 92  Resp: 18 18  Temp: 98.2 F (36.8 C) 97.8 F (36.6 C)  SpO2:  96%    Gen: Awake, Alert, Oriented X 3,  HEENT: no JVD Lungs: Good air movement bilaterally, CTAB CVS: S1S2/RRR Abd: soft, Non tender, non distended, BS present Extremities: trace edema Skin: no new rashes on exposed skin   Discharge Instructions   Discharge Instructions     Diet - low sodium heart healthy   Complete by: As directed    Diet Carb Modified   Complete by: As directed    Increase activity slowly   Complete by: As directed       Allergies as of 12/13/2022   Not on File      Medication List     TAKE these medications    Accu-Chek FastClix Lancets Misc Use as directed (Use as directed)   Accu-Chek Guide w/Device Kit USE AS DIRECTED (USE AS DIRECTED)   atorvastatin 80 MG tablet Commonly known as: LIPITOR Take 1 tablet (80 mg total) by mouth daily. Start taking on: December 14, 2022   blood glucose meter kit and supplies Dispense based on patient and insurance preference. Use up to four times daily as directed. (FOR ICD-10 E10.9, E11.9).   carvedilol 3.125 MG tablet Commonly known as: COREG Take 1 tablet (3.125 mg total) by mouth 2 (two) times daily with a meal.   empagliflozin 10 MG Tabs tablet Commonly known as: JARDIANCE  Take 1 tablet (10 mg total) by mouth daily. Start taking on: December 14, 2022   glucose blood test strip Use as directed (Use as directed)   isosorbide-hydrALAZINE 20-37.5 MG tablet Commonly known as: BiDil Take 0.5 tablets by mouth 3 (three) times daily.   metFORMIN 500 MG tablet Commonly known as: GLUCOPHAGE Take 1 tablet (500 mg total) by mouth 2 (two) times daily with a meal.   sacubitril-valsartan  97-103 MG Commonly known as: ENTRESTO Take 1 tablet by mouth 2 (two) times daily.   spironolactone 25 MG tablet Commonly known as: ALDACTONE Take 1 tablet (25 mg total) by mouth daily. Start taking on: December 14, 2022       Not on File  Follow-up [facility] Follow up on 12/20/2022.   Specialty: Cardiology Why: Follow up in the Advanced Heart Failure clinic 12/20/22 at 2pm Entrance C, free valet Please bring all medications with you Contact information: [address] [phone] [place]. Go on 12/27/2022.   Why: @3$ :15pm Contact information: [address] [place] [phone]                 The results of significant diagnostics from this hospitalization (including imaging, microbiology, ancillary and laboratory) are listed below for reference.    Significant Diagnostic Studies: MR CARDIAC MORPHOLOGY W WO CONTRAST  Result Date: 12/12/2022 CLINICAL DATA:  57F with HTN, T2DM p/w new heart failure (EF 25-30% on echo). Normal coronary arteries on cath EXAM: CARDIAC MRI TECHNIQUE: The patient was scanned on a 1.5 Tesla Siemens magnet. A dedicated cardiac coil was used. Functional imaging was done using Fiesta sequences. 2,3, and 4 chamber views were done to assess for RWMA's. Modified Simpson's rule using a short axis stack was used to calculate an ejection fraction on a dedicated work Conservation officer, nature. The patient received 8 cc of Gadavist. After 10 minutes inversion recovery sequences were used to assess for infiltration and scar tissue. Phase contrast velocity mapping was performed above the aortic and pulmonic valves CONTRAST:  8 cc  of Gadavist FINDINGS: Left ventricle: -Mild hypertrophy -Normal size -Severe systolic dysfunction -Normal ECV (28%) and normal T2 values  -Subendocardial LGE in apical lateral wall suggesting small infarct. Alternatively, sarcoidosis can also cause subendocardial LGE (though sarcoid tends to be multifocal) LV EF:  27% (Normal 52-79%) Absolute volumes: LV EDV: 162m (Normal 78-167 mL) LV ESV: 1114m(Normal 21-64 mL) LV SV: 4268mNormal 52-114 mL) CO: 4.6L/min (Normal 2.7-6.3 L/min) Indexed volumes: LV EDV: 76m83m-m (Normal 50-96 mL/sq-m) LV ESV: 60mL65mm (Normal 10-40 mL/sq-m) LV SV: 22mL/56m (Normal 33-64 mL/sq-m) CI: 2.5L/min/sq-m (Normal 1.9-3.9 L/min/sq-m) Right ventricle: Small size with normal systolic function RV EF: 52% (N123456al 52-80%) Absolute volumes: RV EDV: 79mL (73mal 79-175 mL) RV ESV: 38mL (N26ml 13-75 mL) RV SV: 41mL (No80m 56-110 mL) CO: 4.6L/min (Normal 2.7-6 L/min) Indexed volumes: RV EDV: 42mL/sq-m31mrmal 51-97 mL/sq-m) RV ESV: 20mL/sq-m 8mmal 9-42 mL/sq-m) RV SV: 22mL/sq-m (70mal 35-61 mL/sq-m) CI: 2.4L/min/sq-m (Normal 1.8-3.8 L/min/sq-m) Left atrium: Mild enlargement Right atrium: Normal size Mitral valve: No regurgitation Aortic valve: Mild regurgitation Tricuspid valve: Mild regurgitation Pulmonic valve: Trivial regurgitation Aorta: Normal proximal ascending aorta Pericardium: Small effusion IMPRESSION: 1. Normal LV size, mild hypertrophy, and severe systolic dysfunction (EF 27%) 2.  [id number] size with normal systolic function (EF 52%) 3.123456  Subendocardial LGE in apical lateral wall suggesting small infarct. Alternatively, sarcoidosis can also cause subendocardial LGE (though sarcoid tends to be multifocal) Electronically Signed   By: Oswaldo Milian M.D.   On: 12/12/2022 23:02   MR CARDIAC VELOCITY FLOW MAP  Result Date: 12/12/2022 CLINICAL DATA:  89F with HTN, T2DM p/w new heart failure (EF 25-30% on echo). Normal coronary arteries on cath EXAM: CARDIAC MRI TECHNIQUE: The patient was scanned on a 1.5 Tesla Siemens magnet. A dedicated cardiac coil was used. Functional imaging was done using Fiesta sequences.  2,3, and 4 chamber views were done to assess for RWMA's. Modified Simpson's rule using a short axis stack was used to calculate an ejection fraction on a dedicated work Conservation officer, nature. The patient received 8 cc of Gadavist. After 10 minutes inversion recovery sequences were used to assess for infiltration and scar tissue. Phase contrast velocity mapping was performed above the aortic and pulmonic valves CONTRAST:  8 cc  of Gadavist FINDINGS: Left ventricle: -Mild hypertrophy -Normal size -Severe systolic dysfunction -Normal ECV (28%) and normal T2 values -Subendocardial LGE in apical lateral wall suggesting small infarct. Alternatively, sarcoidosis can also cause subendocardial LGE (though sarcoid tends to be multifocal) LV EF:  27% (Normal 52-79%) Absolute volumes: LV EDV: 156m (Normal 78-167 mL) LV ESV: 1153m(Normal 21-64 mL) LV SV: 4221mNormal 52-114 mL) CO: 4.6L/min (Normal 2.7-6.3 L/min) Indexed volumes: LV EDV: 10m62m-m (Normal 50-96 mL/sq-m) LV ESV: 60mL66mm (Normal 10-40 mL/sq-m) LV SV: 22mL/37m (Normal 33-64 mL/sq-m) CI: 2.5L/min/sq-m (Normal 1.9-3.9 L/min/sq-m) Right ventricle: Small size with normal systolic function RV EF: 52% (N123456al 52-80%) Absolute volumes: RV EDV: 79mL (79mal 79-175 mL) RV ESV: 38mL (N28ml 13-75 mL) RV SV: 41mL (No102m 56-110 mL) CO: 4.6L/min (Normal 2.7-6 L/min) Indexed volumes: RV EDV: 42mL/sq-m36mrmal 51-97 mL/sq-m) RV ESV: 20mL/sq-m 2mmal 9-42 mL/sq-m) RV SV: 22mL/sq-m (60mal 35-61 mL/sq-m) CI: 2.4L/min/sq-m (Normal 1.8-3.8 L/min/sq-m) Left atrium: Mild enlargement Right atrium: Normal size Mitral valve: No regurgitation Aortic valve: Mild regurgitation Tricuspid valve: Mild regurgitation Pulmonic valve: Trivial regurgitation Aorta: Normal proximal ascending aorta Pericardium: Small effusion IMPRESSION: 1. Normal LV size, mild hypertrophy, and severe systolic dysfunction (EF 27%) 2.  [id number] size with normal systolic function (EF 52%) 3. [id number]  LGE in apical lateral wall suggesting small infarct. Alternatively, sarcoidosis can also cause subendocardial LGE (though sarcoid tends to be multifocal) Electronically Signed   By: Christopher Oswaldo Milian02/14/2024 23:02   MR CARDIAC VELOCITY FLOW MAP  Result Date: 12/12/2022 CLINICAL DATA:  89F with HTN, T2DM p/w new heart failure (EF 25-30% on echo). Normal coronary arteries on cath EXAM: CARDIAC MRI TECHNIQUE: The patient was scanned on a 1.5 Tesla Siemens magnet. A dedicated cardiac coil was used. Functional imaging was done using Fiesta sequences. 2,3, and 4 chamber views were done to assess for RWMA's. Modified Simpson's rule using a short axis stack was used to calculate an ejection fraction on a dedicated work station usinConservation officer, naturet received 8 cc of Gadavist. After 10 minutes inversion recovery sequences were used to assess for infiltration and scar tissue. Phase contrast velocity mapping was performed above the aortic and pulmonic valves CONTRAST:  8 cc  of Gadavist FINDINGS: Left ventricle: -Mild hypertrophy -Normal size -Severe systolic dysfunction -Normal ECV (28%) and normal T2 values -Subendocardial LGE in apical lateral wall suggesting small infarct. Alternatively, sarcoidosis can also cause subendocardial LGE (though sarcoid tends to be multifocal) LV EF:  27% (Normal 52-79%)  Absolute volumes: LV EDV: 13m (Normal 78-167 mL) LV ESV: 1137m(Normal 21-64 mL) LV SV: 4247mNormal 52-114 mL) CO: 4.6L/min (Normal 2.7-6.3 L/min) Indexed volumes: LV EDV: 73m39m-m (Normal 50-96 mL/sq-m) LV ESV: 60mL89mm (Normal 10-40 mL/sq-m) LV SV: 22mL/45m (Normal 33-64 mL/sq-m) CI: 2.5L/min/sq-m (Normal 1.9-3.9 L/min/sq-m) Right ventricle: Small size with normal systolic function RV EF: 52% (N123456al 52-80%) Absolute volumes: RV EDV: 79mL (51mal 79-175 mL) RV ESV: 38mL (N66ml 13-75 mL) RV SV: 41mL (No22m 56-110 mL) CO: 4.6L/min (Normal 2.7-6 L/min) Indexed volumes: RV EDV: 42mL/sq-m56mormal 51-97 mL/sq-m) RV ESV: 20mL/sq-m 57mmal 9-42 mL/sq-m) RV SV: 22mL/sq-m (10mal 35-61 mL/sq-m) CI: 2.4L/min/sq-m (Normal 1.8-3.8 L/min/sq-m) Left atrium: Mild enlargement Right atrium: Normal size Mitral valve: No regurgitation Aortic valve: Mild regurgitation Tricuspid valve: Mild regurgitation Pulmonic valve: Trivial regurgitation Aorta: Normal proximal ascending aorta Pericardium: Small effusion IMPRESSION: 1. Normal LV size, mild hypertrophy, and severe systolic dysfunction (EF 27%) 2.  [id number] size with normal systolic function (EF 52%) 3. [id number] LGE in apical lateral wall suggesting small infarct. Alternatively, sarcoidosis can also cause subendocardial LGE (though sarcoid tends to be multifocal) Electronically Signed   By: Christopher Oswaldo Milian02/14/2024 23:02   CARDIAC CATHETERIZATION  Result Date: 12/12/2022 HEMODYNAMICS: RA:   2 mmHg (mean) RV:   16/2 mmHg PA:   23/8 mmHg (15 mean) PCWP:  3 mmHg (mean)    Estimated Fick CO/CI   2.3 L/min, 1.3 L/min/m2    TPG    12  mmHg     PVR     5.2 Wood Units PAPi      7.5  IMPRESSION: 1.Low pre and post capillary filling pressures 2.Elevated PVR secondary to low cardiac output. 3.Severely reduced cardiac output secondary to acute systolic heart failure & hypovolemia. 4. Right dominant coronary circulation with no coronary artery disease RECOMMENDATIONS: - 500cc IVF - Continue HFrEF GDMT Aditya Sabharwal Advanced Heart Failure 9:52 AM   ECHOCARDIOGRAM COMPLETE  Result Date: 12/09/2022    ECHOCARDIOGRAM REPORT   Patient Name:   Lariya KCOSBY PISCIOTTAm: 12/09/2022 Medical Rec #:  [id number]   [id number]:       65.0 in Accession #:    [id number]  [id number]: Date of Birth:  [birth year]BSA: Patient Age:    56 years    57       BP:           150/100 mmHg Patient Gender: F                    HR:           106 bpm. Exam Location:  Inpatient Procedure: Cardiac Doppler, Color Doppler and 2D Echo Indications:     acute diastolic chf  History:        Patient has no prior history of Echocardiogram examinations.                 CHF; Risk Factors:Diabetes and Hypertension.  Sonographer:    Lauren PenniJohny Chessing Phys: [id number] MICHAELMiamintricular ejection fraction, by estimation, is 25 to 30%. The left ventricle has severely decreased function. The left ventricle demonstrates global hypokinesis. There is mild left ventricular hypertrophy. Left ventricular diastolic parameters  are indeterminate.  2. Right ventricular systolic  function is normal. The right ventricular size is normal. Tricuspid regurgitation signal is inadequate for assessing PA pressure.  3. Left atrial size was mildly dilated.  4. The mitral valve is normal in structure. Trivial mitral valve regurgitation. No evidence of mitral stenosis.  5. The aortic valve is tricuspid. Aortic valve regurgitation is not visualized. No aortic stenosis is present.  6. The inferior vena cava is normal in size with greater than 50% respiratory variability, suggesting right atrial pressure of 3 mmHg. FINDINGS  Left Ventricle: Left ventricular ejection fraction, by estimation, is 25 to 30%. The left ventricle has severely decreased function. The left ventricle demonstrates global hypokinesis. The left ventricular internal cavity size was normal in size. There is mild left ventricular hypertrophy. Left ventricular diastolic parameters are indeterminate. Right Ventricle: The right ventricular size is normal. No increase in right ventricular wall thickness. Right ventricular systolic function is normal. Tricuspid regurgitation signal is inadequate for assessing PA pressure. Left Atrium: Left atrial size was mildly dilated. Right Atrium: Right atrial size was normal in size. Pericardium: There is no evidence of pericardial effusion. Mitral Valve: The mitral valve is normal in structure. Trivial mitral valve regurgitation. No evidence of mitral valve  stenosis. Tricuspid Valve: The tricuspid valve is normal in structure. Tricuspid valve regurgitation is trivial. No evidence of tricuspid stenosis. Aortic Valve: The aortic valve is tricuspid. Aortic valve regurgitation is not visualized. No aortic stenosis is present. Pulmonic Valve: The pulmonic valve was normal in structure. Pulmonic valve regurgitation is trivial. No evidence of pulmonic stenosis. Aorta: The aortic root is normal in size and structure. Venous: The inferior vena cava is normal in size with greater than 50% respiratory variability, suggesting right atrial pressure of 3 mmHg. IAS/Shunts: No atrial level shunt detected by color flow Doppler.  LEFT VENTRICLE PLAX 2D LVIDd:         5.40 cm      Diastology LVIDs:         4.80 cm      LV e' medial:    5.33 cm/s LV PW:         1.10 cm      LV E/e' medial:  16.8 LV IVS:        1.00 cm      LV e' lateral:   5.66 cm/s LVOT diam:     1.90 cm      LV E/e' lateral: 15.8 LV SV:         35 LVOT Area:     2.84 cm  LV Volumes (MOD) LV vol d, MOD A4C: 129.0 ml LV vol s, MOD A4C: 95.7 ml LV SV MOD A4C:     129.0 ml RIGHT VENTRICLE             IVC RV Basal diam:  2.40 cm     IVC diam: 1.50 cm RV S prime:     13.60 cm/s TAPSE (M-mode): 1.7 cm LEFT ATRIUM             RIGHT ATRIUM LA diam:        3.80 cm RA Area:     13.00 cm LA Vol (A2C):   74.3 ml RA Volume:   26.70 ml LA Vol (A4C):   71.4 ml LA Biplane Vol: 72.9 ml  AORTIC VALVE LVOT Vmax:   86.60 cm/s LVOT Vmean:  54.100 cm/s LVOT VTI:    0.123 m  AORTA Ao Root diam: 2.90 cm Ao Asc diam:  3.40 cm MITRAL VALVE  MV Area (PHT): 6.71 cm    SHUNTS MV Decel Time: 113 msec    Systemic VTI:  0.12 m MV E velocity: 89.30 cm/s  Systemic Diam: 1.90 cm MV A velocity: 80.50 cm/s MV E/A ratio:  1.11 Cherlynn Kaiser MD Electronically signed by Cherlynn Kaiser MD Signature Date/Time: 12/09/2022/3:47:02 PM    Final    CT Angio Chest PE W and/or Wo Contrast  Result Date: 12/09/2022 CLINICAL DATA:  Low to intermediate probability  for pulmonary embolism EXAM: CT ANGIOGRAPHY CHEST WITH CONTRAST TECHNIQUE: Multidetector CT imaging of the chest was performed using the standard protocol during bolus administration of intravenous contrast. Multiplanar CT image reconstructions and MIPs were obtained to evaluate the vascular anatomy. RADIATION DOSE REDUCTION: This exam was performed according to the departmental dose-optimization program which includes automated exposure control, adjustment of the mA and/or kV according to patient size and/or use of iterative reconstruction technique. CONTRAST:  46m OMNIPAQUE IOHEXOL 350 MG/ML SOLN COMPARISON:  Radiograph from earlier today FINDINGS: Cardiovascular: Satisfactory opacification of the pulmonary arteries to the segmental level. No evidence of pulmonary embolism. Enlarged heart size. Coronary atheromatous calcification. Mediastinum/Nodes: No mass or worrisome lymph node. Uncomplicated gas-filled diverticulum at the left paramedian thoracic inlet. Lungs/Pleura: Symmetric airspace disease with small pleural effusions and some interlobular septal thickening. Upper Abdomen: Cholelithiasis. Musculoskeletal: No acute finding Review of the MIP images confirms the above findings. IMPRESSION: 1. CHF pattern. 2. Negative for pulmonary embolism. 3. Atherosclerosis including the coronary arteries. 4. Cholelithiasis. Electronically Signed   By: JJorje GuildM.D.   On: 12/09/2022 05:30   DG Chest Port 1 View  Result Date: 12/09/2022 CLINICAL DATA:  Shortness of breath. EXAM: PORTABLE CHEST 1 VIEW COMPARISON:  PA single view 12/28/2006 FINDINGS: 3:07 a.m. there is mild cardiomegaly. Increased central vascular distension. Interstitial and patchy consolidative opacities are noted in the lower lung fields most likely due to interstitial and alveolar edema, with pneumonic component not excluded. Small pleural effusions are also beginning to form. The upper lung fields are clear. The mediastinum is normally  outlined. Mild aortic atherosclerosis. Slight thoracic dextroscoliosis. IMPRESSION: Cardiomegaly with increased central vascular distension. Interstitial and patchy consolidative opacities in the lower lung fields are most likely due to interstitial and alveolar edema, with pneumonic component not excluded. Electronically Signed   By: KTelford NabM.D.   On: 12/09/2022 03:39    Microbiology: No results found for this or any previous visit (from the past 240 hour(s)).   Labs: Basic Metabolic Panel: Recent Labs  Lab 12/09/22 0237 12/10/22 0EL:[id number]/13/24 0043 12/12/22 0012 12/12/22 0911 12/12/22 0914 12/12/22 0924 12/13/22 0125  NA 133* 138 139 138 143 143 145 138  K 3.4* 2.9* 3.6 3.9 3.8 3.8 3.5 3.8  CL 98 99 101 99  --   --   --  101  CO2 23 27 28 24  $ --   --   --  25  GLUCOSE 411* 250* 174* 198*  --   --   --  240*  BUN 9 14 15 $ 21*  --   --   --  31*  CREATININE 0.52 0.69 0.61 0.79  --   --   --  0.80  CALCIUM 9.0 8.9 9.3 9.7  --   --   --  9.5  MG  --   --  1.8  --   --   --   --   --    Liver Function Tests: Recent Labs  Lab 12/09/22 0237  AST 43*  ALT 26  ALKPHOS 107  BILITOT 0.7  PROT 7.9  ALBUMIN 3.3*   No results for input(s): "LIPASE", "AMYLASE" in the last 168 hours. No results for input(s): "AMMONIA" in the last 168 hours. CBC: Recent Labs  Lab 12/09/22 0237 12/12/22 0012 12/12/22 0911 12/12/22 0914 12/12/22 0924 12/13/22 0125  WBC 11.2* 9.1  --   --   --  8.7  NEUTROABS 6.9  --   --   --   --   --   HGB 13.7 14.8 15.0 15.3* 13.3 14.2  HCT 40.1 45.0 44.0 45.0 39.0 41.7  MCV 78.0* 78.4*  --   --   --  77.1*  PLT 203 235  --   --   --  265   Cardiac Enzymes: No results for input(s): "CKTOTAL", "CKMB", "CKMBINDEX", "TROPONINI" in the last 168 hours. BNP: BNP (last 3 results) Recent Labs    12/09/22 0237  BNP 283.5*    ProBNP (last 3 results) No results for input(s): "PROBNP" in the last 8760 hours.  CBG: Recent Labs  Lab 12/12/22 1127  12/12/22 1615 12/12/22 2140 12/13/22 0641 12/13/22 1206  GLUCAP 141* 246* 267* 228* 231*       Signed:  Domenic Polite MD.  [facility] 12/13/2022, 12:18 PM

## 2022-12-13 NOTE — Progress Notes (Signed)
Nutrition Brief Note  Consult received for diet education. Full nutrition education provided on 02/12. Checked in with patient to assess for further questions regarding nutrition recommendation. Patient nor her son had any additional questions at this time.   If additional nutrition needs arise prior to discharge, please re-consult RD.   Clayborne Dana, RDN, LDN Clinical Nutrition

## 2022-12-14 NOTE — TOC Initial Note (Signed)
Transition of Care [facility]) - Initial/Assessment Note    Patient Details  Name: Ruth Edwards MRN: [id number] Date of Birth: [birth year]  Transition of Care [facility]) CM/SW Contact:    Erenest Rasher, RN Phone Number: [phone] 12/14/2022, 10:15 AM  Clinical Narrative:                   HF TOC CM spoke to pt and son at bedside. Pt has insurance. Made copy of card and faxed to admitting. Pt has scale at home for daily weights. Arranged PCP appt for pt at [facility] on 2/29 at 3:15 pm. Faxed her insurance card to Admitting Dept.      Expected Discharge Plan: Home/Self Care Barriers to Discharge: No Barriers Identified   Patient Goals and CMS Choice Patient states their goals for this hospitalization and ongoing recovery are:: wants to recover          Expected Discharge Plan and Services   Discharge Planning Services: CM Consult   Living arrangements for the past 2 months: Single Family Home Expected Discharge Date: 12/13/22                                    Prior Living Arrangements/Services Living arrangements for the past 2 months: Single Family Home Lives with:: Spouse Patient language and need for interpreter reviewed:: Yes Do you feel safe going back to the place where you live?: Yes      Need for Family Participation in Patient Care: No (Comment) Care giver support system in place?: Yes (comment)      Activities of Daily Living Home Assistive Devices/Equipment: Bathtub lift ADL Screening (condition at time of admission) Patient's cognitive ability adequate to safely complete daily activities?: Yes Is the patient deaf or have difficulty hearing?: No Does the patient have difficulty seeing, even when wearing glasses/contacts?: No Does the patient have difficulty concentrating, remembering, or making decisions?: No Patient able to express need for assistance with ADLs?: Yes Does the patient have difficulty dressing or bathing?:  No Independently performs ADLs?: Yes (appropriate for developmental age) Does the patient have difficulty walking or climbing stairs?: Yes Weakness of Legs: None Weakness of Arms/Hands: None  Permission Sought/Granted Permission sought to share information with : Case Manager, Family Supports Permission granted to share information with : Yes, Verbal Permission Granted  Share Information with NAME: Deresha Schlehuber     Permission granted to share info w Relationship: husband  Permission granted to share info w Contact Information: [phone]  Emotional Assessment Appearance:: Appears stated age Attitude/Demeanor/Rapport: Engaged Affect (typically observed): Accepting Orientation: : Oriented to Self, Oriented to Place, Oriented to  Time, Oriented to Situation   Psych Involvement: No (comment)  Admission diagnosis:  Hypokalemia [E87.6] Hyponatremia [E87.1] Hyperglycemia [R73.9] Acute CHF (congestive heart failure) (HCC) [I50.9] Hypertensive emergency [I16.1] Acute respiratory failure with hypoxemia ([place]) [J96.01] Acute heart failure, unspecified heart failure type ([place]) [I50.9] Heart failure ([place]) [I50.9] Patient Active Problem List   Diagnosis Date Noted   Class 2 obesity 12/10/2022   Hypokalemia 12/10/2022   Heart failure ([place]) 12/10/2022   Acute on chronic systolic CHF (congestive heart failure) ([place]) 12/09/2022   Uncontrolled type 2 diabetes mellitus with hyperglycemia, without long-term current use of insulin ([place]) 12/09/2022   Essential hypertension 12/09/2022   PCP:  Pcp, No Pharmacy:   [facility] [address] [place] [address]  Phone: [phone] Fax: [phone]     Social Determinants of Health (SDOH) Social History: SDOH Screenings   Food Insecurity: No Food Insecurity (12/10/2022)  Housing: Low Risk  (12/10/2022)  Transportation Needs: No Transportation Needs (12/10/2022)  Utilities: Not At Risk (12/10/2022)   Alcohol Screen: Low Risk  (12/10/2022)  Financial Resource Strain: High Risk (12/10/2022)  Tobacco Use: Low Risk  (12/13/2022)   SDOH Interventions: Alcohol Usage Interventions: Intervention Not Indicated (Score <7) Financial Strain Interventions: Other (Comment)   Readmission Risk Interventions     No data to display

## 2022-12-20 ENCOUNTER — Encounter (HOSPITAL_COMMUNITY): Payer: Self-pay

## 2022-12-20 NOTE — Progress Notes (Incomplete)
Advanced Heart Failure Clinic Note   PCP: Pcp, No PCP-Cardiologist: Dr. Daniel Nones   Reason for Visit: [facility] F/u for systolic heart failure   HPI: 57 y/o female w/ h/o untreated HTN, Type 2DM and evidence of CAD on chest CT, admitted [id number] w/ new systolic heart failure. No routine medical/preventative care in recent years. In ED found to be markedly hypertensive, 190s/120s. EKG NSR w/ LVH and PVCs. CXR w/ pulmonary edema. Chest CT negative for PE and dissection. Coronary calcifications noted. Hs trop 17. BNP 283. Na 133, K 3.4, , Scr 0.52, AST 43, ALT 26 Hgb A1c 12.3 (new diagnosis of DM). She was started on IV Lasix and antihypertensive regimen. Echo was done showing reduced EF, 25-30%, + LV trabeculations. RV normal. LHC showed right dominant coronary circulation with no coronary artery disease. RHC demonstrated low pre and post capillary filling pressures, elevated PVR secondary to low cardiac output, severely reduced cardiac output secondary to acute systolic heart failure & hypovolemia (suspected over diuresis). Was given gentle IVF hydration back. GDMT further titrated. cMRI: LVEF 27%, RVEF 52%, Subendocardial LGE in apical lateral wall suggesting small infarct. Alternatively, sarcoidosis can also cause subendocardial LGE (though sarcoid tends to be multifocal). Findings felt to be insignificant. D/c on 2/15. D/c wt 183 lb.        R/LHC 2/24 HEMODYNAMICS: RA:                  2 mmHg (mean) RV:                  16/2 mmHg PA:                  23/8 mmHg (15 mean) PCWP:            3 mmHg (mean)                                      Estimated Fick CO/CI   2.3 L/min, 1.3 L/min/m2                                              TPG                 12  mmHg                                            PVR                 5.2 Wood Units  PAPi                7.5       IMPRESSION: 1.Low pre and post capillary filling pressures 2.Elevated PVR secondary to low cardiac output.  3.Severely  reduced cardiac output secondary to acute systolic heart failure & hypovolemia.  4. Right dominant coronary circulation with no coronary artery disease    cMRI 2/24  IMPRESSION: 1. Normal LV size, mild hypertrophy, and severe systolic dysfunction (EF [id number])   2.  Small RV size with normal systolic function (EF 123456)   3. Subendocardial LGE in apical lateral wall suggesting small infarct. Alternatively, sarcoidosis can also cause subendocardial LGE (though sarcoid tends  to be multifocal)   Review of Systems: [y] = yes, [ ]$  = no   General: Weight gain [ ]$ ; Weight loss [ ]$ ; Anorexia [ ]$ ; Fatigue [ ]$ ; Fever [ ]$ ; Chills [ ]$ ; Weakness [ ]$   Cardiac: Chest pain/pressure [ ]$ ; Resting SOB [ ]$ ; Exertional SOB [ ]$ ; Orthopnea [ ]$ ; Pedal Edema [ ]$ ; Palpitations [ ]$ ; Syncope [ ]$ ; Presyncope [ ]$ ; Paroxysmal nocturnal dyspnea[ ]$   Pulmonary: Cough [ ]$ ; Wheezing[ ]$ ; Hemoptysis[ ]$ ; Sputum [ ]$ ; Snoring [ ]$   GI: Vomiting[ ]$ ; Dysphagia[ ]$ ; Melena[ ]$ ; Hematochezia [ ]$ ; Heartburn[ ]$ ; Abdominal pain [ ]$ ; Constipation [ ]$ ; Diarrhea [ ]$ ; BRBPR [ ]$   GU: Hematuria[ ]$ ; Dysuria [ ]$ ; Nocturia[ ]$   Vascular: Pain in legs with walking [ ]$ ; Pain in feet with lying flat [ ]$ ; Non-healing sores [ ]$ ; Stroke [ ]$ ; TIA [ ]$ ; Slurred speech [ ]$ ;  Neuro: Headaches[ ]$ ; Vertigo[ ]$ ; Seizures[ ]$ ; Paresthesias[ ]$ ;Blurred vision [ ]$ ; Diplopia [ ]$ ; Vision changes [ ]$   Ortho/Skin: Arthritis [ ]$ ; Joint pain [ ]$ ; Muscle pain [ ]$ ; Joint swelling [ ]$ ; Back Pain [ ]$ ; Rash [ ]$   Psych: Depression[ ]$ ; Anxiety[ ]$   Heme: Bleeding problems [ ]$ ; Clotting disorders [ ]$ ; Anemia [ ]$   Endocrine: Diabetes [ ]$ ; Thyroid dysfunction[ ]$    Past Medical History:  Diagnosis Date   DM2 (diabetes mellitus, type 2) (HCC)    HTN (hypertension)     Current Outpatient Medications  Medication Sig Dispense Refill   Accu-Chek Softclix Lancets lancets Use as directed up to 4 times daily 100 each 0   atorvastatin (LIPITOR) 80 MG tablet Take 1 tablet (80 mg total) by  mouth daily. 30 tablet 0   blood glucose meter kit and supplies Dispense based on patient and insurance preference. Use up to four times daily as directed. (FOR ICD-10 E10.9, E11.9). 1 each 0   Blood Glucose Monitoring Suppl (ACCU-CHEK GUIDE) w/Device KIT USE AS DIRECTED 1 kit 0   carvedilol (COREG) 3.125 MG tablet Take 1 tablet (3.125 mg total) by mouth 2 (two) times daily with a meal. 60 tablet 0   empagliflozin (JARDIANCE) 10 MG TABS tablet Take 1 tablet (10 mg total) by mouth daily. 30 tablet 0   glucose blood test strip Use as directed 100 each 0   isosorbide-hydrALAZINE (BIDIL) 20-37.5 MG tablet Take 0.5 tablets by mouth 3 (three) times daily. 45 tablet 0   metFORMIN (GLUCOPHAGE) 500 MG tablet Take 1 tablet (500 mg total) by mouth 2 (two) times daily with a meal. 60 tablet 0   sacubitril-valsartan (ENTRESTO) 97-103 MG Take 1 tablet by mouth 2 (two) times daily. 60 tablet 0   spironolactone (ALDACTONE) 25 MG tablet Take 1 tablet (25 mg total) by mouth daily. 30 tablet 0   No current facility-administered medications for this visit.    Not on File    Social History   Socioeconomic History   Marital status: Married    Spouse name: IBrahim   Number of children: Not on file   Years of education: Not on file   Highest education level: Not on file  Occupational History   Not on file  Tobacco Use   Smoking status: Never   Smokeless tobacco: Never  Vaping Use   Vaping Use: Never used  Substance and Sexual Activity   Alcohol use: Never   Drug use: Never   Sexual activity: Yes  Other Topics Concern   Not on file  Social History Narrative   Not on file   Social Determinants of Health   Financial Resource Strain: High Risk (12/10/2022)   Overall Financial Resource Strain (CARDIA)    Difficulty of Paying Living Expenses: Hard  Food Insecurity: No Food Insecurity (12/10/2022)   Hunger Vital Sign    Worried About Running Out of Food in the Last Year: Never true    Ran Out of Food  in the Last Year: Never true  Transportation Needs: No Transportation Needs (12/10/2022)   PRAPARE - Hydrologist (Medical): No    Lack of Transportation (Non-Medical): No  Physical Activity: Not on file  Stress: Not on file  Social Connections: Not on file  Intimate Partner Violence: Not At Risk (12/10/2022)   Humiliation, Afraid, Rape, and Kick questionnaire    Fear of Current or Ex-Partner: No    Emotionally Abused: No    Physically Abused: No    Sexually Abused: No     No family history on file.  There were no vitals filed for this visit.   PHYSICAL EXAM: General:  Well appearing. No respiratory difficulty HEENT: normal Neck: supple. no JVD. Carotids 2+ bilat; no bruits. No lymphadenopathy or thyromegaly appreciated. Cor: PMI nondisplaced. Regular rate & rhythm. No rubs, gallops or murmurs. Lungs: clear Abdomen: soft, nontender, nondistended. No hepatosplenomegaly. No bruits or masses. Good bowel sounds. Extremities: no cyanosis, clubbing, rash, edema Neuro: alert & oriented x 3, cranial nerves grossly intact. moves all 4 extremities w/o difficulty. Affect pleasant.  ECG:   ASSESSMENT & PLAN:  1. Acute Systolic Heart Failure - New. Echo 2/24 EF 25-30%, RV ok  - NICM. No CAD on LHC, RHC w/ severely reduced cardiac index likely secondary to hypovolemia (very low filling pressures: mRA2, 23/8/15 mmHg, mPCW 3, Estimated Fick CO/CI   2.3 L/min, 1.3 L/min/m2)>>Received 476m IVF post cath  - cMRI LVEF 27%, RVEF 52%, Subendocardial LGE in apical lateral wall suggesting small infarct. Alternatively, sarcoidosis can also cause subendocardial LGE (though sarcoid tends to be multifocal). Findings welt to be insignificant  - Suspect most likely HTN CM +/p PVC mediated ? Zio  - Continue Entresto  97-103 mg bid - Continue Spironolactone 25 mg daily  - Continue Jardiance 10 mg daily  - Continue 1/2 tablet Bidil tid  - Continue Coreg 3.125. Further titrate  if output ok on RHC, for now too low to up titrate   2. Hypertension  - recent admit w/ hypertensive urgency  - under better control w/ GDMT. Ritration per above  - outpatient sleep study to exclude OSA    3. Coronary Calcifications  - coronary calcifications noted on chest CT - LHC 2/24, no CAD   4. Type 2DM  - new diagnosis, uncontrolled Hgb A1c 12.3 - insulin per primary team  - Continue Jardiance - Continue statin  - UA - proteinuria  - will need to establish care w/ PCP post d/c   5. HLD - LDL elevated 149. LDL Goal < 70  - statin recently added, atorva 40 mg - will need FLP and HFTs in 6 wks  6. Obesity  - There is no height or weight on file to calculate BMI. - wt loss advised  - w/ T2DM consider GLP1   F/u ***   BLyda Jester PA-C 12/20/22

## 2022-12-26 ENCOUNTER — Encounter (HOSPITAL_COMMUNITY): Payer: Self-pay

## 2022-12-27 ENCOUNTER — Ambulatory Visit (INDEPENDENT_AMBULATORY_CARE_PROVIDER_SITE_OTHER): Payer: BLUE CROSS/BLUE SHIELD | Admitting: Student

## 2022-12-27 ENCOUNTER — Telehealth (HOSPITAL_COMMUNITY): Payer: Self-pay

## 2022-12-27 ENCOUNTER — Encounter: Payer: Self-pay | Admitting: Student

## 2022-12-27 ENCOUNTER — Other Ambulatory Visit: Payer: Self-pay

## 2022-12-27 VITALS — BP 188/106 | HR 109 | Temp 98.1°F | Ht 61.0 in | Wt 187.6 lb

## 2022-12-27 DIAGNOSIS — I110 Hypertensive heart disease with heart failure: Secondary | ICD-10-CM

## 2022-12-27 DIAGNOSIS — I5020 Unspecified systolic (congestive) heart failure: Secondary | ICD-10-CM

## 2022-12-27 DIAGNOSIS — E785 Hyperlipidemia, unspecified: Secondary | ICD-10-CM

## 2022-12-27 DIAGNOSIS — I10 Essential (primary) hypertension: Secondary | ICD-10-CM

## 2022-12-27 DIAGNOSIS — I5022 Chronic systolic (congestive) heart failure: Secondary | ICD-10-CM

## 2022-12-27 DIAGNOSIS — E1165 Type 2 diabetes mellitus with hyperglycemia: Secondary | ICD-10-CM | POA: Diagnosis not present

## 2022-12-27 DIAGNOSIS — K5900 Constipation, unspecified: Secondary | ICD-10-CM

## 2022-12-27 DIAGNOSIS — Z7689 Persons encountering health services in other specified circumstances: Secondary | ICD-10-CM

## 2022-12-27 DIAGNOSIS — Z7984 Long term (current) use of oral hypoglycemic drugs: Secondary | ICD-10-CM

## 2022-12-27 MED ORDER — ISOSORB DINITRATE-HYDRALAZINE 20-37.5 MG PO TABS: 0.5000 | ORAL_TABLET | Freq: Three times a day (TID) | ORAL | 0 refills | Status: DC

## 2022-12-27 MED ORDER — METFORMIN HCL 500 MG PO TABS: 1000.0000 mg | ORAL_TABLET | Freq: Two times a day (BID) | ORAL | 2 refills | Status: DC

## 2022-12-27 MED ORDER — POLYETHYLENE GLYCOL 3350 17 G PO PACK: 17.0000 g | PACK | Freq: Every day | ORAL | 0 refills | Status: DC

## 2022-12-27 NOTE — Patient Instructions (Signed)
[name], it was a pleasure seeing you today!  Today we discussed: - For your heart failure and blood pressure, continue taking the medications as prescribed.   - For your diabetes I would like for you to increase your metformin to '1000mg'$  twice daily. Please bring in your meter with the sugars at that time.  - Please make sure to follow-up with our clinic after you return from your trip.   - I have ordered Miralax for constipation.   I have ordered the following labs today:  Lab Orders         BMP8+Anion Gap     Referrals ordered today:   Referral Orders         Ambulatory referral to diabetic education      I have ordered the following medication/changed the following medications:   Start the following medications: Meds ordered this encounter  Medications   isosorbide-hydrALAZINE (BIDIL) 20-37.5 MG tablet    Sig: Take 0.5 tablets by mouth 3 (three) times daily.    Dispense:  45 tablet    Refill:  0    ok to change to 45 tabs per MD  maf   polyethylene glycol (MIRALAX) 17 g packet    Sig: Take 17 g by mouth daily.    Dispense:  30 each    Refill:  0     Follow-up:  3 weeks    Please make sure to arrive 15 minutes prior to your next appointment. If you arrive late, you may be asked to reschedule.   We look forward to seeing you next time. Please call our clinic at [phone] if you have any questions or concerns. The best time to call is Monday-Friday from 9am-4pm, but there is someone available 24/7. If after hours or the weekend, call the main hospital number and ask for the Internal Medicine Resident On-Call. If you need medication refills, please notify your pharmacy one week in advance and they will send [place] a request.  Thank you for letting us take part in your care. Wishing you the best!  Thank you, Sanjuan Dame, MD

## 2022-12-27 NOTE — Telephone Encounter (Signed)
Called and left patient a voice message to confirm/remind patient of their appointment at the [facility] on 12/28/22.

## 2022-12-28 ENCOUNTER — Encounter (HOSPITAL_COMMUNITY): Payer: Self-pay

## 2022-12-28 ENCOUNTER — Other Ambulatory Visit (HOSPITAL_COMMUNITY): Payer: Self-pay

## 2022-12-28 ENCOUNTER — Ambulatory Visit (HOSPITAL_COMMUNITY)
Admission: RE | Admit: 2022-12-28 | Discharge: 2022-12-28 | Disposition: A | Discharge status: Home / Self Care | Payer: BLUE CROSS/BLUE SHIELD | Source: Ambulatory Visit | Attending: Family Medicine | Admitting: Family Medicine

## 2022-12-28 VITALS — BP 194/98 | HR 122 | Wt 185.0 lb

## 2022-12-28 DIAGNOSIS — I10 Essential (primary) hypertension: Secondary | ICD-10-CM

## 2022-12-28 DIAGNOSIS — E119 Type 2 diabetes mellitus without complications: Secondary | ICD-10-CM | POA: Insufficient documentation

## 2022-12-28 DIAGNOSIS — E785 Hyperlipidemia, unspecified: Secondary | ICD-10-CM | POA: Insufficient documentation

## 2022-12-28 DIAGNOSIS — E669 Obesity, unspecified: Secondary | ICD-10-CM | POA: Diagnosis not present

## 2022-12-28 DIAGNOSIS — I2510 Atherosclerotic heart disease of native coronary artery without angina pectoris: Secondary | ICD-10-CM | POA: Insufficient documentation

## 2022-12-28 DIAGNOSIS — Z6834 Body mass index (BMI) 34.0-34.9, adult: Secondary | ICD-10-CM | POA: Insufficient documentation

## 2022-12-28 DIAGNOSIS — I428 Other cardiomyopathies: Secondary | ICD-10-CM | POA: Insufficient documentation

## 2022-12-28 DIAGNOSIS — I110 Hypertensive heart disease with heart failure: Secondary | ICD-10-CM | POA: Insufficient documentation

## 2022-12-28 DIAGNOSIS — I5022 Chronic systolic (congestive) heart failure: Secondary | ICD-10-CM

## 2022-12-28 DIAGNOSIS — Z79899 Other long term (current) drug therapy: Secondary | ICD-10-CM | POA: Insufficient documentation

## 2022-12-28 DIAGNOSIS — K5900 Constipation, unspecified: Secondary | ICD-10-CM | POA: Insufficient documentation

## 2022-12-28 DIAGNOSIS — Z7689 Persons encountering health services in other specified circumstances: Secondary | ICD-10-CM | POA: Insufficient documentation

## 2022-12-28 DIAGNOSIS — Z7984 Long term (current) use of oral hypoglycemic drugs: Secondary | ICD-10-CM | POA: Diagnosis not present

## 2022-12-28 LAB — BASIC METABOLIC PANEL
Anion gap: 13 (ref 5–15)
BUN: 10 mg/dL (ref 6–20)
CO2: 21 mmol/L — ABNORMAL LOW (ref 22–32)
Calcium: 9.1 mg/dL (ref 8.9–10.3)
Chloride: 104 mmol/L (ref 98–111)
Creatinine, Ser: 0.61 mg/dL (ref 0.44–1.00)
GFR, Estimated: >60 mL/min (ref >60)
Glucose, Bld: 80 mg/dL (ref 70–99)
Potassium: 3.6 mmol/L (ref 3.5–5.1)
Sodium: 138 mmol/L (ref 135–145)

## 2022-12-28 LAB — BRAIN NATRIURETIC PEPTIDE: B Natriuretic Peptide: 101.5 pg/mL — ABNORMAL HIGH (ref 0.0–100.0)

## 2022-12-28 MED ORDER — CARVEDILOL 6.25 MG PO TABS
6.2500 mg | ORAL_TABLET | Freq: Two times a day (BID) | ORAL | 3 refills | Status: DC
  Filled 2022-12-28: qty 180, 90d supply, fill #0

## 2022-12-28 MED ORDER — ISOSORB DINITRATE-HYDRALAZINE 20-37.5 MG PO TABS
1.0000 | ORAL_TABLET | Freq: Three times a day (TID) | ORAL | 8 refills | Status: DC
  Filled 2022-12-28 (×2): qty 90, 30d supply, fill #0
  Filled 2023-01-14: qty 90, 30d supply, fill #1

## 2022-12-28 NOTE — Assessment & Plan Note (Signed)
Patient reports since hospitalization she has had some difficulty with bowel movements. She has been having one every day but says she feels like the medications have been backing her up some, especially as she was completely medication naive prior to this. On exam today her abdomen is soft, non-distended, and non-tender. We will start Miralax as needed for this.  - Miralax daily as needed

## 2022-12-28 NOTE — Assessment & Plan Note (Signed)
Patient recently presented to ED with dyspnea, found to be in acute heart failure. Echo revealed EF 25-30%, mild LVH, normal right ventricular function. Ischemic work-up without evidence of significant coronary artery disease. 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 Patient was diuresed in the hospital, discharged with weight at 183lb. At home she has been weighing herself daily, consistently 183-184lb, including this morning. She has been altering her diet for her diabetes and heart failure, trying to limit carb and salt intake. She has felt well since discharge, denying any dyspnea, cough, chest pain, palpitations, orthopnea, PND.   Today Ms. Venturi remains hypertensive, although she has been out of Bidil for the last few days. We reviewed her medications with her daughter. We discussed ensuring she remains on these medications, especially as she is to go out of the country for a few days in two weeks. Today she appears euvolemic on exam with just trace pitting edema, no JVD. She has follow-up with the heart failure clinic tomorrow.  - Follow-up with advanced heart failure tomorrow - Delene Loll 97-'103mg'$  twice daily - Carvedilol 3.'125mg'$  twice daily - Spironolactone '25mg'$  daily - Bidil 20-37.'5mg'$  three times daily - Jardiance '10mg'$  daily - BMP today

## 2022-12-28 NOTE — Progress Notes (Signed)
Patient Name: Ruth Edwards         DOB: 1966/05/31       Height: 29f 1in    Weight:185 lbs   Office Name: Advance Heart Failure clinic         Referring Provider: JAllena KatzNP  Today's Date: 12/28/2022  Date: 12/28/2022   STOP BANG RISK ASSESSMENT S (snore) Have you been told that you snore?     YES   T (tired) Are you often tired, fatigued, or sleepy during the day?   YES  O (obstruction) Do you stop breathing, choke, or gasp during sleep? NO   P (pressure) Do you have or are you being treated for high blood pressure? YES   B (BMI) Is your body index greater than 35 kg/m? NO   A (age) Are you 566years old or older? YES   N (neck) Do you have a neck circumference greater than 16 inches?   NO   G (gender) Are you a female? NO   TOTAL STOP/BANG "YES" ANSWERS 4                                                                       For Office Use Only              Procedure Order Form    YES to 3+ Stop Bang questions OR two clinical symptoms - patient qualifies for WatchPAT (CPT 95800)             Clinical Notes: Will consult Sleep Specialist and refer for management of therapy due to patient increased risk of Sleep Apnea. Ordering a sleep study due to the following two clinical symptoms: Excessive daytime sleepiness G47.10 / Gastroesophageal reflux K21.9 / Nocturia R35.1 / Morning Headaches G44.221 / Difficulty concentrating R41.840 / Memory problems or poor judgment G31.84 / Personality changes or irritability R45.4 / Loud snoring R06.83 / Depression F32.9 / Unrefreshed by sleep G47.8 / Impotence N52.9 / History of high blood pressure R03.0 / Insomnia G47.00    I understand that I am proceeding with a home sleep apnea test as ordered by my treating physician. I understand that untreated sleep apnea is a serious cardiovascular risk factor and it is my responsibility to perform the test and seek management for sleep apnea. I will be contacted with the results and be  managed for sleep apnea by a local sleep physician. I will be receiving equipment and further instructions from [facility] I shall promptly ship back the equipment via the included mailing label. I understand my insurance will be billed for the test and as the patient I am responsible for any insurance related out-of-pocket costs incurred. I have been provided with written instructions and can call for additional video or telephonic instruction, with 24-hour availability of qualified personnel to answer any questions: Patient Help Desk [phone]  Patient Signature ______________________________________________________   Date______________________ Patient Telemedicine Verbal Consent

## 2022-12-28 NOTE — Addendum Note (Signed)
Addended bySanjuan Dame on: 12/28/2022 09:21 AM   Modules accepted: Level of Service

## 2022-12-28 NOTE — Assessment & Plan Note (Addendum)
During patient's most recent hospitalization for heart failure she was found to have A1c of 12.3%. No previous diagnosis of diabetes. In the hospital she was started on metformin '500mg'$  twice daily and Jardiance. Since being home, she reports she takes her sugars typically late at night, ranging usually 150-250. She has been working on her diet to decrease her carbohydrates, although this has been difficult given her traditional [place] African diet. She is asymptomatic, denying polyuria, polydipsia, vision changes, neuropathy.  Today we discussed the diagnosis and gave education regarding possible complications of long-term uncontrolled diabetes. Given I do not have any objective data today and her sugars appear to be okay at home, I will hold off starting insulin therapy today. Plan to have her return after her trip (she is going to [place] the week of 3/11 for 5d) for diabetes follow-up. We also discussed referral to our diabetic educator for further assistance. They are interested in CGM as well. In the meantime, I have encouraged her to take a log of fasting sugars to bring back when she returns.  - Increase metformin '1000mg'$  twice daily - Continue Jardiance '10mg'$  daily- - Carb modified diet - Diabetic education referral - Keep log of fasting sugars - Return in a few weeks to discuss CGM, go over log - Consider addition of GLP-1

## 2022-12-28 NOTE — Assessment & Plan Note (Signed)
Patient's blood pressure continues to be significantly elevated with systolic pressures 123456 today in clinic. She has been out of her Bidil for the last few days, but reports compliance with the rest of her medications. She is asymptomatic today. Suspect she has been chronically hypertensive at this level, I do not see a need for admission for this. We will re-prescribe Bidil for her and have her follow-up with heart failure clinic tomorrow. Could consider secondary work-up for hypertension - STOP-BANG is 2 today due to age and high blood pressure. TSH in the hospital normal. Would consider renal ultrasound and aldo/renin at next visit.   - Continue Entresto, carvedilol, spironolactone, Bidil - Could consider addition of amlodipine at next visit - Consider further secondary work-up if remains significantly elevated despite multi-drug therapy

## 2022-12-28 NOTE — Assessment & Plan Note (Addendum)
Recent lipid panel during hospitalization revealed LDL 149 and she was initiated on high-intensity statin for primary prevention. She denies any side effects since initiating this. Can consider reducing to '40mg'$  if she starts having myalgias.  - Continue atorvastatin '80mg'$  daily

## 2022-12-28 NOTE — Patient Instructions (Addendum)
Thank you for coming in today  Labs were done today, if any labs are abnormal the clinic will call you No news is good news  Medications: INCREASE Bidil to 1 tablet 3 times daily INCREASE Coreg to 6.25 mg twice daily   Follow up appointments:  Your physician recommends that you schedule a follow-up appointment in:  3 weeks in clinic  12 weeks with echocardiogram with Dr. Elpidio Anis will receive a reminder letter in the mail a few months in advance. If you don't receive a letter, please call our office to schedule the follow-up appointment.  CHECK BLOOD PRESSURE AT HOME A LOG IT, PLEASE BRING WITH YOU TO NEXT APPOINTMENT     YOU HAVE BEEN SET UP FOR [facility] HOME SLEEP STUDY  Do the following things EVERYDAY: Weigh yourself in the morning before breakfast. Write it down and keep it in a log. Take your medicines as prescribed Eat low salt foods--Limit salt (sodium) to 2000 mg per day.  Stay as active as you can everyday Limit all fluids for the day to less than 2 liters   At the [facility], you and your health needs are our priority. As part of our continuing mission to provide you with exceptional heart care, we have created designated Provider Care Teams. These Care Teams include your primary Cardiologist (physician) and Advanced Practice Providers (APPs- Physician Assistants and Nurse Practitioners) who all work together to provide you with the care you need, when you need it.   You may see any of the following providers on your designated Care Team at your next follow up: Dr Glori Bickers Dr Loralie Champagne Dr. Roxana Hires, NP Lyda Jester, [place] [facility] [place], [place] Forestine Na, NP Audry Riles, PharmD   Please be sure to bring in all your medications bottles to every appointment.    Thank you for choosing [facility]  If you have any questions or concerns before your next  appointment please send [place] a message through [place] or call our office at [phone].    TO LEAVE A MESSAGE FOR THE NURSE SELECT OPTION 2, PLEASE LEAVE A MESSAGE INCLUDING: YOUR NAME DATE OF BIRTH CALL BACK NUMBER REASON FOR CALL**this is important as we prioritize the call backs  YOU WILL RECEIVE A CALL BACK THE SAME DAY AS LONG AS YOU CALL BEFORE 4:00 PM

## 2022-12-28 NOTE — Assessment & Plan Note (Addendum)
Patient is presenting to our clinic today to establish care. Prior to her most recent hospitalization she had not been seen by a doctor in many years. She denies any past medical history or medications she took prior to this. She is originally from [place] and moved to the [place] in 2004. She can complete her ADL's independently. Denies any history of smoking, alcohol, or recreational drug use.  Patient does need up-to-date age appropriate cancer screenings. We discussed getting better control of these acute issues before pursuing these. She denies any fevers, chills, weight loss, night sweats, chest pain, dyspnea, cough, abdominal pain, nausea, vomiting, hematochezia, melena, dysuria, numbness/paresthesias.

## 2022-12-28 NOTE — Progress Notes (Signed)
CC: establish care, hospital follow-up  HPI:  RuthRuth Edwards is a 57 y.o. person with medical history as below presenting to [facility] for hospital follow-up and establish care.   Please see problem-based list for further details, assessments, and plans.  Past Medical History:  Diagnosis Date   DM2 (diabetes mellitus, type 2) ([place])    HTN (hypertension)    Review of Systems:  As per HPI  Physical Exam:  Vitals:   12/27/22 1541 12/27/22 1549 12/27/22 1553 12/27/22 1615  BP: (!) 228/126 (!) 202/123 (!) 203/102 (!) 188/106  Pulse: (!) 126 (!) 115 (!) 105 (!) 109  Temp: 98.1 F (36.7 C)     SpO2: 99%     Weight: 187 lb 9.6 oz (85.1 kg)     Height: '5\' 1"'$  (1.549 m)      General: Resting comfortably in no acute distress HENT: Normocephalic, atraumatic. Moist mucous membranes. Eyes: Vision grossly in tact. No scleral icterus CV: Regular rate, rhythm. No murmurs appreciated. Warm extremities. No JVD appreciated. Pulm: Normal work of breathing on room air. Clear to auscultation bilaterally. GI: Abdomen soft, non-tender, non-distended. Normoactive bowel sounds. MSK: Normal bulk, tone. Trace pitting edema noted bilaterally to shins. Skin: Warm, dry. No rashes or lesions. Neuro: Awake, alert, conversing appropriately.  Psych: Normal mood, affect, speech.   Assessment & Plan:   Chronic systolic heart failure [facility]) Patient recently presented to ED with dyspnea, found to be in acute heart failure. Echo revealed EF 25-30%, mild LVH, normal right ventricular function. Ischemic work-up without evidence of significant coronary artery disease. 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 Patient was diuresed in the hospital, discharged with weight at 183lb. At home she has been weighing herself daily, consistently 183-184lb, including this morning. She has been altering her diet for her diabetes and heart failure, trying to limit carb and salt intake. She has felt well since  discharge, denying any dyspnea, cough, chest pain, palpitations, orthopnea, PND.   Today Ruth Edwards remains hypertensive, although she has been out of Bidil for the last few days. We reviewed her medications with her daughter. We discussed ensuring she remains on these medications, especially as she is to go out of the country for a few days in two weeks. Today she appears euvolemic on exam with just trace pitting edema, no JVD. She has follow-up with the heart failure clinic tomorrow.  - Follow-up with advanced heart failure tomorrow - Delene Loll 97-'103mg'$  twice daily - Carvedilol 3.'125mg'$  twice daily - Spironolactone '25mg'$  daily - Bidil 20-37.'5mg'$  three times daily - Jardiance '10mg'$  daily - BMP today   Essential hypertension Patient's blood pressure continues to be significantly elevated with systolic pressures [id number] today in clinic. She has been out of her Bidil for the last few days, but reports compliance with the rest of her medications. She is asymptomatic today. Suspect she has been chronically hypertensive at this level, I do not see a need for admission for this. We will re-prescribe Bidil for her and have her follow-up with heart failure clinic tomorrow. Could consider secondary work-up for hypertension - STOP-BANG is 2 today due to age and high blood pressure. TSH in the hospital normal. Would consider renal ultrasound and aldo/renin at next visit.   - Continue Entresto, carvedilol, spironolactone, Bidil - Could consider addition of amlodipine at next visit - Consider further secondary work-up if remains significantly elevated despite multi-drug therapy  Uncontrolled type 2 diabetes mellitus with hyperglycemia, without long-term current use of insulin ([place])  During patient's most recent hospitalization for heart failure she was found to have A1c of 12.3%. No previous diagnosis of diabetes. In the hospital she was started on metformin '500mg'$  twice daily and Jardiance. Since being home, she  reports she takes her sugars typically late at night, ranging usually 150-250. She has been working on her diet to decrease her carbohydrates, although this has been difficult given her traditional [place] African diet. She is asymptomatic, denying polyuria, polydipsia, vision changes, neuropathy.  Today we discussed the diagnosis and gave education regarding possible complications of long-term uncontrolled diabetes. Given I do not have any objective data today and her sugars appear to be okay at home, I will hold off starting insulin therapy today. Plan to have her return after her trip (she is going to [place] the week of 3/11 for 5d) for diabetes follow-up. We also discussed referral to our diabetic educator for further assistance. They are interested in CGM as well. In the meantime, I have encouraged her to take a log of fasting sugars to bring back when she returns.  - Increase metformin '1000mg'$  twice daily - Continue Jardiance '10mg'$  daily- - Carb modified diet - Diabetic education referral - Keep log of fasting sugars - Return in a few weeks to discuss CGM, go over log - Consider addition of GLP-1  Encounter to establish care Patient is presenting to our clinic today to establish care. Prior to her most recent hospitalization she had not been seen by a doctor in many years. She denies any past medical history or medications she took prior to this. She is originally from [place] and moved to the [place] in 2004. She can complete her ADL's independently. Denies any history of smoking, alcohol, or recreational drug use.  Patient does need up-to-date age appropriate cancer screenings. We discussed getting better control of these acute issues before pursuing these. She denies any fevers, chills, weight loss, night sweats, chest pain, dyspnea, cough, abdominal pain, nausea, vomiting, hematochezia, melena, dysuria, numbness/paresthesias.   Hyperlipidemia Recent lipid panel during hospitalization  revealed LDL 149 and she was initiated on high-intensity statin for primary prevention. She denies any side effects since initiating this. Can consider reducing to '40mg'$  if she starts having myalgias.  - Continue atorvastatin '80mg'$  daily  Constipation Patient reports since hospitalization she has had some difficulty with bowel movements. She has been having one every day but says she feels like the medications have been backing her up some, especially as she was completely medication naive prior to this. On exam today her abdomen is soft, non-distended, and non-tender. We will start Miralax as needed for this.  - Miralax daily as needed   Patient discussed with Dr. Denita Lung, MD Internal Medicine PGY-3 Pager: [phone]

## 2022-12-28 NOTE — Progress Notes (Signed)
Advanced Heart Failure Clinic Note   PCP: Dr. Collene Gobble HF Cardiologist: Dr. Daniel Nones   Reason for Visit: [facility] F/u for systolic heart failure   HPI: 57 y.o. female w/ h/o untreated HTN, DM2, and new diagnosis of systolic heart failure.  She was admitted [id number] w/ new systolic heart failure. No routine medical/preventative care in recent years. BP 190s/120s in ED. She was started on IV Lasix and antihypertensive regimen. Echo showed EF, 25-30%, + LV trabeculations. RV normal. LHC showed right dominant coronary circulation with no coronary artery disease. RHC demonstrated low pre and post capillary filling pressures, elevated PVR secondary to low cardiac output, severely reduced cardiac output secondary to acute systolic heart failure & hypovolemia (suspected over diuresis). Was given gentle IVF hydration back. GDMT further titrated. cMRI: LVEF 27%, RVEF 52%, Subendocardial LGE in apical lateral wall suggesting small infarct. Alternatively, sarcoidosis can also cause subendocardial LGE (though sarcoid tends to be multifocal). Findings felt to be insignificant. She was discharged home, weight 183 lbs.  Today she returns for post hospital HF follow up with her husband. Overall feeling fine. She is not short of breath with activity. Denies palpitations, headache, vision change, CP, dizziness, edema, or PND/Orthopnea. Appetite ok. No fever or chills. Weight at home 183 pounds. Taking all medications but recently ran out of BiDil. Husband says she snores sometimes.   Cardiac Studies  - cMRI 2/24: LVEF 27% with mild hypertrophy, RV EF 52%, subendocardial LGE in apical lateral wall suggesting small infarct. Alternatively, sarcoidosis can also cause subendocardial LGE (though sarcoid tends to be multifocal)  - R/LHC 2/24 RA 2 RV 16/2 RA 23/8 (15) PCWP 3 CO/CI (Fick) 2.3/1.3 PVR 5.2 WU PAPi 7.5  IMPRESSION: 1.Low pre and post capillary filling pressures 2.Elevated PVR secondary to low  cardiac output.  3.Severely reduced cardiac output secondary to acute systolic heart failure & hypovolemia.  4. Right dominant coronary circulation with no coronary artery disease  - Echo (2/24): EF 25-30%, + LV trabeculations. RV normal.   Review of Systems: [y] = yes, '[ ]'$  = no   General: Weight gain '[ ]'$ ; Weight loss '[ ]'$ ; Anorexia '[ ]'$ ; Fatigue '[ ]'$ ; Fever '[ ]'$ ; Chills '[ ]'$ ; Weakness '[ ]'$   Cardiac: Chest pain/pressure '[ ]'$ ; Resting SOB '[ ]'$ ; Exertional SOB '[ ]'$ ; Orthopnea '[ ]'$ ; Pedal Edema '[ ]'$ ; Palpitations '[ ]'$ ; Syncope '[ ]'$ ; Presyncope '[ ]'$ ; Paroxysmal nocturnal dyspnea'[ ]'$   Pulmonary: Cough '[ ]'$ ; Wheezing'[ ]'$ ; Hemoptysis'[ ]'$ ; Sputum '[ ]'$ ; Snoring '[ ]'$   GI: Vomiting'[ ]'$ ; Dysphagia'[ ]'$ ; Melena'[ ]'$ ; Hematochezia '[ ]'$ ; Heartburn'[ ]'$ ; Abdominal pain '[ ]'$ ; Constipation '[ ]'$ ; Diarrhea '[ ]'$ ; BRBPR '[ ]'$   GU: Hematuria'[ ]'$ ; Dysuria '[ ]'$ ; Nocturia'[ ]'$   Vascular: Pain in legs with walking '[ ]'$ ; Pain in feet with lying flat '[ ]'$ ; Non-healing sores '[ ]'$ ; Stroke '[ ]'$ ; TIA '[ ]'$ ; Slurred speech '[ ]'$ ;  Neuro: Headaches'[ ]'$ ; Vertigo'[ ]'$ ; Seizures'[ ]'$ ; Paresthesias'[ ]'$ ;Blurred vision '[ ]'$ ; Diplopia '[ ]'$ ; Vision changes '[ ]'$   Ortho/Skin: Arthritis '[ ]'$ ; Joint pain '[ ]'$ ; Muscle pain '[ ]'$ ; Joint swelling '[ ]'$ ; Back Pain '[ ]'$ ; Rash '[ ]'$   Psych: Depression'[ ]'$ ; Anxiety'[ ]'$   Heme: Bleeding problems '[ ]'$ ; Clotting disorders '[ ]'$ ; Anemia '[ ]'$   Endocrine: Diabetes Blue.Reese ]; Thyroid dysfunction'[ ]'$   Past Medical History:  Diagnosis Date   DM2 (diabetes mellitus, type 2) ([place])    HTN (hypertension)    Current Outpatient Medications  Medication Sig Dispense Refill  Accu-Chek Softclix Lancets lancets Use as directed up to 4 times daily 100 each 0   atorvastatin (LIPITOR) 80 MG tablet Take 1 tablet (80 mg total) by mouth daily. 30 tablet 0   blood glucose meter kit and supplies Dispense based on patient and insurance preference. Use up to four times daily as directed. (FOR ICD-10 E10.9, E11.9). 1 each 0   Blood Glucose Monitoring Suppl (ACCU-CHEK GUIDE) w/Device KIT USE AS  DIRECTED 1 kit 0   carvedilol (COREG) 6.25 MG tablet Take 1 tablet (6.25 mg total) by mouth 2 (two) times daily. 180 tablet 3   empagliflozin (JARDIANCE) 10 MG TABS tablet Take 1 tablet (10 mg total) by mouth daily. 30 tablet 0   glucose blood test strip Use as directed 100 each 0   metFORMIN (GLUCOPHAGE) 500 MG tablet Take 2 tablets (1,000 mg total) by mouth 2 (two) times daily with a meal. 120 tablet 2   polyethylene glycol (MIRALAX) 17 g packet Take 17 g by mouth daily. 30 each 0   sacubitril-valsartan (ENTRESTO) 97-103 MG Take 1 tablet by mouth 2 (two) times daily. 60 tablet 0   spironolactone (ALDACTONE) 25 MG tablet Take 1 tablet (25 mg total) by mouth daily. 30 tablet 0   isosorbide-hydrALAZINE (BIDIL) 20-37.5 MG tablet Take 1 tablet by mouth 3 (three) times daily. 90 tablet 8   No current facility-administered medications for this encounter.   Not on File  Social History   Socioeconomic History   Marital status: Married    Spouse name: IBrahim   Number of children: Not on file   Years of education: Not on file   Highest education level: Not on file  Occupational History   Not on file  Tobacco Use   Smoking status: Never   Smokeless tobacco: Never  Vaping Use   Vaping Use: Never used  Substance and Sexual Activity   Alcohol use: Never   Drug use: Never   Sexual activity: Yes  Other Topics Concern   Not on file  Social History Narrative   Not on file   Social Determinants of Health   Financial Resource Strain: High Risk (12/10/2022)   Overall Financial Resource Strain (CARDIA)    Difficulty of Paying Living Expenses: Hard  Food Insecurity: No Food Insecurity (12/27/2022)   Hunger Vital Sign    Worried About Running Out of Food in the Last Year: Never true    Ran Out of Food in the Last Year: Never true  Transportation Needs: No Transportation Needs (12/10/2022)   PRAPARE - Hydrologist (Medical): No    Lack of Transportation  (Non-Medical): No  Physical Activity: Not on file  Stress: Not on file  Social Connections: Not on file  Intimate Partner Violence: Not At Risk (12/27/2022)   Humiliation, Afraid, Rape, and Kick questionnaire    Fear of Current or Ex-Partner: No    Emotionally Abused: No    Physically Abused: No    Sexually Abused: No     No family history on file.  BP (!) 194/98   Pulse (!) 122   Wt 83.9 kg (185 lb)   SpO2 98%   BMI 34.96 kg/m   Wt Readings from Last 3 Encounters:  12/28/22 83.9 kg (185 lb)  12/27/22 85.1 kg (187 lb 9.6 oz)  12/13/22 83.2 kg (183 lb 8 oz)   PHYSICAL EXAM: General:  NAD. No resp difficulty HEENT: Normal Neck: Supple. No JVD. Carotids 2+ bilat; no  bruits. No lymphadenopathy or thryomegaly appreciated. Cor: PMI nondisplaced. Tachy rate & rhythm. No rubs, gallops or murmurs. Lungs: Clear Abdomen: Soft, nontender, nondistended. No hepatosplenomegaly. No bruits or masses. Good bowel sounds. Extremities: No cyanosis, clubbing, rash, edema Neuro: Alert & oriented x 3, cranial nerves grossly intact. Moves all 4 extremities w/o difficulty. Affect pleasant.  ECG: ST 115 bpm (personally reviewed).  ASSESSMENT & PLAN: 1. Chronic Systolic Heart Failure - New. NICM - Echo (2/24): EF 25-30%, RV ok  - No CAD on LHC, RHC w/ severely reduced cardiac index likely secondary to hypovolemia (very low filling pressures: mRA2, 23/8/15 mmHg, mPCW 3, Estimated Fick CO/CI   2.3 L/min, 1.3 L/min/m2)>>Received 455m IVF post cath  - cMRI (2/24): LVEF 27%, RVEF 52%, Subendocardial LGE in apical lateral wall suggesting small infarct. Alternatively, sarcoidosis can also cause subendocardial LGE (though sarcoid tends to be multifocal). Findings felt to be insignificant  - Suspect most likely HTN CM  - NYHA I today, volume stable. - Increase BiDil to 1 tab tid. - Increase Coreg to 6.25 mg bid. - Continue Entresto  97-103 mg bid - Continue spironolactone 25 mg daily  - Continue  Jardiance 10 mg daily  - Labs today. - Repeat echo when GDMT titrated.   2. Hypertension  - recent admit w/ hypertensive urgency  - Recently out of BiDil, just restarted. BP 200s today. - Arrange home sleep study to rule out OSA - Check BP at home, bring log to next visit - PCP planning renal u/s and aldo/renin   3. Coronary Calcifications  - coronary calcifications noted on chest CT - LHC 2/24, no CAD - No chest pain  4. Type 2DM  - new diagnosis, uncontrolled Hgb A1c 12.3 - Continue Jardiance, no GU symptoms. - PCP following  5. HLD - LDL elevated 149. LDL Goal < 70  - Continue statin - will need FLP and HFTs in 6 wks  6. Obesity  - Body mass index is 34.96 kg/m. - wt loss advised  - w/ T2DM consider GLP1  Follow up in 3 weeks with APP for med/BP check and 12 weeks with Dr. SDaniel Nones+ echo.  JDelta FNP 12/28/22

## 2022-12-29 LAB — BMP8+ANION GAP
Anion Gap: 20 mmol/L — ABNORMAL HIGH (ref 10.0–18.0)
BUN/Creatinine Ratio: 15 (ref 9–23)
BUN: 8 mg/dL (ref 6–24)
CO2: 19 mmol/L — ABNORMAL LOW (ref 20–29)
Calcium: 9.8 mg/dL (ref 8.7–10.2)
Chloride: 105 mmol/L (ref 96–106)
Creatinine, Ser: 0.52 mg/dL — ABNORMAL LOW (ref 0.57–1.00)
Glucose: 101 mg/dL — ABNORMAL HIGH (ref 70–99)
Potassium: 3.8 mmol/L (ref 3.5–5.2)
Sodium: 144 mmol/L (ref 134–144)
eGFR: 108 mL/min/{1.73_m2} (ref >59)

## 2022-12-31 NOTE — Progress Notes (Signed)
Internal Medicine Clinic Attending  Case discussed with the resident at the time of the visit.  We reviewed the resident's history and exam and pertinent patient test results.  I agree with the assessment, diagnosis, and plan of care documented in the resident's note.  

## 2023-01-02 ENCOUNTER — Telehealth (HOSPITAL_COMMUNITY): Payer: Self-pay | Admitting: Surgery

## 2023-01-02 NOTE — Telephone Encounter (Signed)
I called patient and left a message to indicate that she can proceed with doing the study as insurance precert is not indicating.

## 2023-01-14 ENCOUNTER — Other Ambulatory Visit (HOSPITAL_COMMUNITY): Payer: Self-pay

## 2023-01-15 ENCOUNTER — Other Ambulatory Visit (HOSPITAL_COMMUNITY): Payer: Self-pay

## 2023-01-15 ENCOUNTER — Telehealth (HOSPITAL_COMMUNITY): Payer: Self-pay

## 2023-01-15 NOTE — Telephone Encounter (Signed)
Called and left patient a voice message  to confirm/remind patient of their appointment at the [facility] on 01/16/23.

## 2023-01-16 ENCOUNTER — Encounter (HOSPITAL_COMMUNITY): Payer: Self-pay

## 2023-01-16 ENCOUNTER — Other Ambulatory Visit (HOSPITAL_COMMUNITY): Payer: Self-pay

## 2023-01-16 ENCOUNTER — Ambulatory Visit (HOSPITAL_COMMUNITY)
Admission: RE | Admit: 2023-01-16 | Discharge: 2023-01-16 | Disposition: A | Discharge status: Home / Self Care | Payer: BLUE CROSS/BLUE SHIELD | Source: Ambulatory Visit | Attending: Family Medicine | Admitting: Family Medicine

## 2023-01-16 VITALS — BP 198/110 | HR 112 | Wt 182.8 lb

## 2023-01-16 DIAGNOSIS — Z7984 Long term (current) use of oral hypoglycemic drugs: Secondary | ICD-10-CM | POA: Insufficient documentation

## 2023-01-16 DIAGNOSIS — I5022 Chronic systolic (congestive) heart failure: Secondary | ICD-10-CM | POA: Diagnosis not present

## 2023-01-16 DIAGNOSIS — I110 Hypertensive heart disease with heart failure: Secondary | ICD-10-CM | POA: Diagnosis not present

## 2023-01-16 DIAGNOSIS — Z79899 Other long term (current) drug therapy: Secondary | ICD-10-CM | POA: Diagnosis not present

## 2023-01-16 DIAGNOSIS — E669 Obesity, unspecified: Secondary | ICD-10-CM

## 2023-01-16 DIAGNOSIS — E785 Hyperlipidemia, unspecified: Secondary | ICD-10-CM | POA: Diagnosis not present

## 2023-01-16 DIAGNOSIS — Z5986 Financial insecurity: Secondary | ICD-10-CM | POA: Diagnosis not present

## 2023-01-16 DIAGNOSIS — I5023 Acute on chronic systolic (congestive) heart failure: Secondary | ICD-10-CM | POA: Insufficient documentation

## 2023-01-16 DIAGNOSIS — Z6834 Body mass index (BMI) 34.0-34.9, adult: Secondary | ICD-10-CM | POA: Diagnosis not present

## 2023-01-16 DIAGNOSIS — I2510 Atherosclerotic heart disease of native coronary artery without angina pectoris: Secondary | ICD-10-CM | POA: Diagnosis not present

## 2023-01-16 DIAGNOSIS — I160 Hypertensive urgency: Secondary | ICD-10-CM | POA: Diagnosis not present

## 2023-01-16 DIAGNOSIS — E861 Hypovolemia: Secondary | ICD-10-CM | POA: Insufficient documentation

## 2023-01-16 DIAGNOSIS — I428 Other cardiomyopathies: Secondary | ICD-10-CM | POA: Insufficient documentation

## 2023-01-16 DIAGNOSIS — E119 Type 2 diabetes mellitus without complications: Secondary | ICD-10-CM | POA: Diagnosis not present

## 2023-01-16 MED ORDER — SACUBITRIL-VALSARTAN 97-103 MG PO TABS
1.0000 | ORAL_TABLET | Freq: Two times a day (BID) | ORAL | 8 refills | Status: DC
  Filled 2023-01-16: qty 60, 30d supply, fill #0

## 2023-01-16 MED ORDER — SPIRONOLACTONE 25 MG PO TABS
25.0000 mg | ORAL_TABLET | Freq: Every day | ORAL | 8 refills | Status: DC
  Filled 2023-01-16: qty 30, 30d supply, fill #0

## 2023-01-16 MED ORDER — ISOSORB DINITRATE-HYDRALAZINE 20-37.5 MG PO TABS
1.0000 | ORAL_TABLET | Freq: Three times a day (TID) | ORAL | 8 refills | Status: DC
  Filled 2023-01-16 – 2023-02-04 (×2): qty 90, 30d supply, fill #0

## 2023-01-16 MED ORDER — ATORVASTATIN CALCIUM 80 MG PO TABS
80.0000 mg | ORAL_TABLET | Freq: Every day | ORAL | 8 refills | Status: DC
  Filled 2023-01-16: qty 30, 30d supply, fill #0

## 2023-01-16 MED ORDER — EMPAGLIFLOZIN 10 MG PO TABS
10.0000 mg | ORAL_TABLET | Freq: Every day | ORAL | 8 refills | Status: DC
  Filled 2023-01-16: qty 30, 30d supply, fill #0

## 2023-01-16 MED ORDER — CARVEDILOL 6.25 MG PO TABS
6.2500 mg | ORAL_TABLET | Freq: Two times a day (BID) | ORAL | 8 refills | Status: DC
  Filled 2023-01-16: qty 30, 15d supply, fill #0

## 2023-01-16 NOTE — Progress Notes (Signed)
ReDS Vest / Clip - 01/16/23 1515       ReDS Vest / Clip   Station Marker B    Ruler Value 32    ReDS Value Range Low volume    ReDS Actual Value 30    Anatomical Comments siting

## 2023-01-16 NOTE — Progress Notes (Signed)
Advanced Heart Failure Clinic Note   PCP: Dr. Collene Gobble HF Cardiologist: Dr. Daniel Nones   Reason for Visit: F/u for systolic heart failure   HPI: 57 y.o. female w/ h/o untreated HTN, DM2, and new diagnosis of systolic heart failure.  She was admitted [id number] w/ new systolic heart failure. No routine medical/preventative care in recent years. BP 190s/120s in ED. She was started on IV Lasix and antihypertensive regimen. Echo showed EF, 25-30%, + LV trabeculations. RV normal. LHC showed right dominant coronary circulation with no coronary artery disease. RHC demonstrated low pre and post capillary filling pressures, elevated PVR secondary to low cardiac output, severely reduced cardiac output secondary to acute systolic heart failure & hypovolemia (suspected over diuresis). Was given gentle IVF hydration back. GDMT further titrated. cMRI: LVEF 27%, RVEF 52%, Subendocardial LGE in apical lateral wall suggesting small infarct. Alternatively, sarcoidosis can also cause subendocardial LGE (though sarcoid tends to be multifocal). Findings felt to be insignificant. She was discharged home, weight 183 lbs.  Post hospital follow up 3/24, NYHA I and volume stable. BP elevated and BiDil and Coreg increased. Home sleep study arranged.   Today she returns for HF follow up with her husband. Overall feeling fine. She is not short of breath with activity. Denies palpitations, CP, dizziness, edema, or PND/Orthopnea. Appetite ok. No fever or chills. Weight at home 181 pounds. Out of Entresto, Lipitor and Jardiance x 3 days. She has BP monitor at home but does not check. She is fasting for Ramadan, not taking afternoon dose of BiDil.   Cardiac Studies  - cMRI 2/24: LVEF 27% with mild hypertrophy, RV EF 52%, subendocardial LGE in apical lateral wall suggesting small infarct. Alternatively, sarcoidosis can also cause subendocardial LGE (though sarcoid tends to be multifocal)  - R/LHC 2/24 RA 2 RV 16/2 RA 23/8  (15) PCWP 3 CO/CI (Fick) 2.3/1.3 PVR 5.2 WU PAPi 7.5  IMPRESSION: 1.Low pre and post capillary filling pressures 2.Elevated PVR secondary to low cardiac output.  3.Severely reduced cardiac output secondary to acute systolic heart failure & hypovolemia.  4. Right dominant coronary circulation with no coronary artery disease  - Echo (2/24): EF 25-30%, + LV trabeculations. RV normal.  Past Medical History:  Diagnosis Date   DM2 (diabetes mellitus, type 2) (HCC)    HTN (hypertension)    Current Outpatient Medications  Medication Sig Dispense Refill   Accu-Chek Softclix Lancets lancets Use as directed up to 4 times daily 100 each 0   blood glucose meter kit and supplies Dispense based on patient and insurance preference. Use up to four times daily as directed. (FOR ICD-10 E10.9, E11.9). 1 each 0   Blood Glucose Monitoring Suppl (ACCU-CHEK GUIDE) w/Device KIT USE AS DIRECTED 1 kit 0   glucose blood test strip Use as directed 100 each 0   metFORMIN (GLUCOPHAGE) 500 MG tablet Take 2 tablets (1,000 mg total) by mouth 2 (two) times daily with a meal. 120 tablet 2   polyethylene glycol (MIRALAX) 17 g packet Take 17 g by mouth daily. 30 each 0   atorvastatin (LIPITOR) 80 MG tablet Take 1 tablet (80 mg total) by mouth daily. 30 tablet 8   carvedilol (COREG) 6.25 MG tablet Take 1 tablet (6.25 mg total) by mouth 2 (two) times daily. 30 tablet 8   empagliflozin (JARDIANCE) 10 MG TABS tablet Take 1 tablet (10 mg total) by mouth daily. 30 tablet 8   isosorbide-hydrALAZINE (BIDIL) 20-37.5 MG tablet Take 1 tablet by mouth 3 (three) times daily.  90 tablet 8   sacubitril-valsartan (ENTRESTO) 97-103 MG Take 1 tablet by mouth 2 (two) times daily. 60 tablet 8   spironolactone (ALDACTONE) 25 MG tablet Take 1 tablet (25 mg total) by mouth daily. 30 tablet 8   No current facility-administered medications for this encounter.   Not on File  Social History   Socioeconomic History   Marital status: Married     Spouse name: IBrahim   Number of children: Not on file   Years of education: Not on file   Highest education level: Not on file  Occupational History   Not on file  Tobacco Use   Smoking status: Never   Smokeless tobacco: Never  Vaping Use   Vaping Use: Never used  Substance and Sexual Activity   Alcohol use: Never   Drug use: Never   Sexual activity: Yes  Other Topics Concern   Not on file  Social History Narrative   Not on file   Social Determinants of Health   Financial Resource Strain: High Risk (12/10/2022)   Overall Financial Resource Strain (CARDIA)    Difficulty of Paying Living Expenses: Hard  Food Insecurity: No Food Insecurity (12/27/2022)   Hunger Vital Sign    Worried About Running Out of Food in the Last Year: Never true    Ran Out of Food in the Last Year: Never true  Transportation Needs: No Transportation Needs (12/10/2022)   PRAPARE - Hydrologist (Medical): No    Lack of Transportation (Non-Medical): No  Physical Activity: Not on file  Stress: Not on file  Social Connections: Not on file  Intimate Partner Violence: Not At Risk (12/27/2022)   Humiliation, Afraid, Rape, and Kick questionnaire    Fear of Current or Ex-Partner: No    Emotionally Abused: No    Physically Abused: No    Sexually Abused: No    No family history on file.  BP (!) 198/110   Pulse (!) 112   Wt 82.9 kg (182 lb 12.8 oz)   SpO2 99%   BMI 34.54 kg/m   Wt Readings from Last 3 Encounters:  01/16/23 82.9 kg (182 lb 12.8 oz)  12/28/22 83.9 kg (185 lb)  12/27/22 85.1 kg (187 lb 9.6 oz)   PHYSICAL EXAM: General:  NAD. No resp difficulty HEENT: Normal Neck: Supple. No JVD. Carotids 2+ bilat; no bruits. No lymphadenopathy or thryomegaly appreciated. Cor: PMI nondisplaced. Regular rate & rhythm. No rubs, gallops or murmurs. Lungs: Clear Abdomen: Soft, nontender, nondistended. No hepatosplenomegaly. No bruits or masses. Good bowel  sounds. Extremities: No cyanosis, clubbing, rash, edema Neuro: Alert & oriented x 3, cranial nerves grossly intact. Moves all 4 extremities w/o difficulty. Affect pleasant.  ECG (personally reviewed): ST + LVH 108 bpm  ReDs: 30%  ASSESSMENT & PLAN: 1. Chronic Systolic Heart Failure - New. NICM - Echo (2/24): EF 25-30%, RV ok  - No CAD on LHC, RHC w/ severely reduced cardiac index likely secondary to hypovolemia (very low filling pressures: mRA2, 23/8/15 mmHg, mPCW 3, Estimated Fick CO/CI   2.3 L/min, 1.3 L/min/m2)>>Received 478mL IVF post cath  - cMRI (2/24): LVEF 27%, RVEF 52%, Subendocardial LGE in apical lateral wall suggesting small infarct. Alternatively, sarcoidosis can also cause subendocardial LGE (though sarcoid tends to be multifocal). Findings felt to be insignificant  - Suspect most likely HTN CM  - NYHA I today, volume stable. ReDs 30% - Restart Entresto 97/103 mg bid - Restart Jardiance 10 mg daily. - Continue  BiDil 1 tab tid. - Continue Coreg 6.25 mg bid. - Continue spironolactone 25 mg daily  - Discussed importance of medication compliance. I personally filled a pillbox for her today. - Repeat echo 2-3 months   2. Hypertension  - recent admit w/ hypertensive urgency  - She has home sleep study, OK to proceed with study to rule out OSA. - PCP planning renal u/s and aldo/renin - BP 214/110 today in clinic, out of several meds x 3 days. I asked her to take her afternoon dose of BiDil in clinic. Re-check BP 198/110. Needs to pick up Entresto from pharmacy today. - Check BP at home, bring log to next visit - Restart meds as above. - Consider referral to HTN clinic   3. Coronary Calcifications  - coronary calcifications noted on chest CT - LHC 2/24, no CAD. - No chest pain. - Restart statin.  4. Type 2DM  - new diagnosis, uncontrolled Hgb A1c 12.3 - Restart Jardiance. - PCP following  5. HLD - LDL elevated 149. LDL Goal < 70  - Has been off statin, restart  atorva 80 - will need FLP and HFTs in 6 wks  6. Obesity  - Body mass index is 34.54 kg/m.  - w/ T2DM consider GLP1  Follow up in 2-3 weeks with PharmD for med and BP check (will need BMET), and 2-3 months with Dr. Daniel Nones + echo. All cardiac meds refilled.  [place], FNP 01/16/23

## 2023-01-16 NOTE — Patient Instructions (Addendum)
Thank you for coming in today  [place] were done today, if any labs are abnormal the clinic will call you No news is good news  Medications: RESTART Entresto 97/103 mg 1 tablet twice daily RESTART Jardiance 10 mg 1 tablet daily  RESTART Atorvastatin 80 mg 1 tablet daily   Follow up appointments:  Your physician recommends that you schedule a follow-up appointment in:  3 weeks with Pharmacy with BMET  2-3  months With Dr. Daniel Nones with echocardiogram   Your physician has requested that you have an echocardiogram. Echocardiography is a painless test that uses sound waves to create images of your heart. It provides your doctor with information about the size and shape of your heart and how well your heart's chambers and valves are working. This procedure takes approximately one hour. There are no restrictions for this procedure.       Do the following things EVERYDAY: Weigh yourself in the morning before breakfast. Write it down and keep it in a log. Take your medicines as prescribed Eat low salt foods--Limit salt (sodium) to 2000 mg per day.  Stay as active as you can everyday Limit all fluids for the day to less than 2 liters   At the [facility], you and your health needs are our priority. As part of our continuing mission to provide you with exceptional heart care, we have created designated Provider Care Teams. These Care Teams include your primary Cardiologist (physician) and Advanced Practice Providers (APPs- Physician Assistants and Nurse Practitioners) who all work together to provide you with the care you need, when you need it.   You may see any of the following providers on your designated Care Team at your next follow up: Dr Glori Bickers Dr Loralie Champagne Dr. Roxana Hires, NP Lyda Jester, [place] [facility] [place], [place] Forestine Na,  NP Audry Riles, PharmD   Please be sure to bring in all your medications bottles to every appointment.    Thank you for choosing [facility]  If you have any questions or concerns before your next appointment please send [place] a message through [place] or call our office at [phone].    TO LEAVE A MESSAGE FOR THE NURSE SELECT OPTION 2, PLEASE LEAVE A MESSAGE INCLUDING: YOUR NAME DATE OF BIRTH CALL BACK NUMBER REASON FOR CALL**this is important as we prioritize the call backs  YOU WILL RECEIVE A CALL BACK THE SAME DAY AS LONG AS YOU CALL BEFORE 4:00 PM

## 2023-02-04 ENCOUNTER — Other Ambulatory Visit (HOSPITAL_COMMUNITY): Payer: Self-pay

## 2023-02-13 ENCOUNTER — Ambulatory Visit (HOSPITAL_COMMUNITY)
Admission: RE | Admit: 2023-02-13 | Discharge: 2023-02-13 | Disposition: A | Discharge status: Home / Self Care | Payer: BLUE CROSS/BLUE SHIELD | Source: Ambulatory Visit | Attending: Internal Medicine | Admitting: Internal Medicine

## 2023-02-13 ENCOUNTER — Other Ambulatory Visit (HOSPITAL_COMMUNITY): Payer: Self-pay

## 2023-02-13 VITALS — BP 186/98 | HR 107 | Wt 177.2 lb

## 2023-02-13 DIAGNOSIS — E669 Obesity, unspecified: Secondary | ICD-10-CM | POA: Diagnosis not present

## 2023-02-13 DIAGNOSIS — I428 Other cardiomyopathies: Secondary | ICD-10-CM | POA: Diagnosis not present

## 2023-02-13 DIAGNOSIS — Z6834 Body mass index (BMI) 34.0-34.9, adult: Secondary | ICD-10-CM | POA: Diagnosis not present

## 2023-02-13 DIAGNOSIS — Z79899 Other long term (current) drug therapy: Secondary | ICD-10-CM | POA: Diagnosis not present

## 2023-02-13 DIAGNOSIS — E785 Hyperlipidemia, unspecified: Secondary | ICD-10-CM | POA: Diagnosis not present

## 2023-02-13 DIAGNOSIS — I2510 Atherosclerotic heart disease of native coronary artery without angina pectoris: Secondary | ICD-10-CM | POA: Insufficient documentation

## 2023-02-13 DIAGNOSIS — I5022 Chronic systolic (congestive) heart failure: Secondary | ICD-10-CM | POA: Insufficient documentation

## 2023-02-13 DIAGNOSIS — Z7984 Long term (current) use of oral hypoglycemic drugs: Secondary | ICD-10-CM | POA: Diagnosis not present

## 2023-02-13 DIAGNOSIS — I110 Hypertensive heart disease with heart failure: Secondary | ICD-10-CM | POA: Insufficient documentation

## 2023-02-13 MED ORDER — ISOSORB DINITRATE-HYDRALAZINE 20-37.5 MG PO TABS
2.0000 | ORAL_TABLET | Freq: Three times a day (TID) | ORAL | 6 refills | Status: DC
  Filled 2023-02-13: qty 180, 30d supply, fill #0

## 2023-02-13 MED ORDER — AMLODIPINE BESYLATE 10 MG PO TABS
10.0000 mg | ORAL_TABLET | Freq: Every day | ORAL | 11 refills | Status: DC
  Filled 2023-02-13: qty 30, 30d supply, fill #0
  Filled 2023-04-09: qty 30, 30d supply, fill #1
  Filled 2023-09-02: qty 30, 30d supply, fill #2

## 2023-02-13 NOTE — Progress Notes (Signed)
Advanced Heart Failure Clinic Note  PCP: Dr. Marijo Conception HF Cardiologist: Dr. Gasper Lloyd   HPI:  57 y.o. female w/ h/o untreated HTN, DM2, and new diagnosis of systolic heart failure.   She was admitted 11/2022 w/ new systolic heart failure. No routine medical/preventative care in recent years. BP 190s/120s in ED. She was started on IV Lasix and antihypertensive regimen. Echo showed EF 25-30%, + LV trabeculations. RV normal. LHC showed right dominant coronary circulation with no coronary artery disease. RHC demonstrated low pre and post capillary filling pressures, elevated PVR secondary to low cardiac output, severely reduced cardiac output secondary to acute systolic heart failure & hypovolemia (suspected over diuresis). Was given gentle IVF hydration back. GDMT further titrated. cMRI: LVEF 27%, RVEF 52%, Subendocardial LGE in apical lateral wall suggesting small infarct. Alternatively, sarcoidosis can also cause subendocardial LGE (though sarcoid tends to be multifocal). Findings felt to be insignificant. She was discharged home, weight 183 lbs.   Post hospital follow up 12/2022, NYHA I and volume stable. BP elevated and BiDil and carvedilol increased. Home sleep study arranged.    She returned for HF follow up on 01/16/23. Overall was feeling fine. Denied SOB. Denied palpitations, CP, dizziness, edema, or PND/Orthopnea. Appetite was ok. Weight at home 181 pounds. Out of Entresto, Lipitor and Jardiance x 3 days. Not checking BP at home. She was fasting for Ramadan at the time, not taking afternoon dose of BiDil. She was instructed to take her afternoon BiDil dose in clinic. Initial BP of 214/116 mmHg, followed by 198/110 after BiDil administration.  Today she returns to HF clinic for pharmacist medication titration. At last visit with APP, she was instructed to restart Entresto and Jardiance. She presents with her husband for the visit. She reports that she is feeling better overall. Denies dizziness,  headaches, lightheadedness, fatigue. Denies any chest pain or palpitations. Denies any SOB with exertion. Able to complete all ADLs. Weight at home 174 pounds. No LEE on exam. Denies PND/Orthopnea. Appetite is normal, but she has reduced her overall intake. Working to limit her salt intake. She brought in her pill box, medications, and blood pressure cuff for the visit today. She endorsed medication adherence, however only certain days and times had a few medications inside. She administered her medications 5 minutes prior to the appointment start time. BP in clinic initially was 210/125, improved to 186/98 on repeat 15 minutes later.   HF Medications: Carvedilol 6.25 mg BID Entresto 97/103 mg BID Spironolactone 25 mg once daily Jardiance 10 mg once daily BiDil 20/37.5 1 tablet TID  Has the patient been experiencing any side effects to the medications prescribed?  No  Does the patient have any problems obtaining medications due to transportation or finances?   [employer]  Understanding of regimen: poor Understanding of indications: poor Potential of compliance: poor Patient understands to avoid NSAIDs. Patient understands to avoid decongestants.    Pertinent Lab Values: 12/28/22: Serum creatinine 0.61, BUN 10, Potassium 3.6, Sodium 138, BNP 101.5  Vital Signs: Weight: 177 lbs (last clinic weight: 182 lbs) Blood pressure: 210/125, 194/104, 186/98   Heart rate: 107   Assessment/Plan: Chronic Systolic Heart Failure - New. NICM - Echo (11/2022): EF 25-30%, RV ok  - No CAD on LHC, RHC w/ severely reduced cardiac index likely secondary to hypovolemia (very low filling pressures: mRA2, 23/8/15 mmHg, mPCW 3, Estimated Fick CO/CI   2.3 L/min, 1.3 L/min/m2)>>Received IVF post cath  - cMRI (11/2022): LVEF  27%, RVEF 52%, Subendocardial LGE in apical lateral wall suggesting small infarct. Alternatively, sarcoidosis can also cause subendocardial LGE (though  sarcoid tends to be multifocal). Findings felt to be insignificant  - Suspect most likely HTN CM. She took her medications 5 minutes prior to the appointment start time. BP was elevated, but downtrending throughout the visit: 210/125, 194/104, 186/98.   - NYHA I today - Continue carvedilol 6.25 mg bid. - Continue Entresto 97/103 mg bid - Continue spironolactone 25 mg daily  - Continue Jardiance 10 mg daily. - Increase BiDil 37.5/20 mg to 2 tablets TID - Discussed importance of medication compliance. - Repeat echo 1-2 months per APP - Obtain BMET at next visit   2. Hypertension  - Recent admit w/ hypertensive urgency.  - Concern for nonadherence d/t lack of understanding of medication regimen.  - Has been referred for home sleep study - PCP planning renal u/s and aldo/renin - Continue carvedilol 6.25 mg BID. - Continue Entresto 97/103 mg BID - Continue spironolactone 25 mg daily  - Increase BiDil 37.5/20 mg to 2 tablets TID - Initiate amlodipine 10 mg once daily   3. Coronary Calcifications  - Coronary calcifications noted on chest CT - LHC 11/2022, no CAD. - No chest pain. - Continue statin.   4. Type 2DM  - New diagnosis, uncontrolled Hgb A1c 12.3 - Continue Jardiance. - Encouraged PCP follow up   5. HLD - LDL elevated 149. LDL Goal < 70  - Continue atorvastatin 80 mg daily   6. Obesity  - Body mass index is 34.54 kg/m.  - With dx of T2DM could consider GLP1  Follow up 02/20/23 in pharmacy clinic. Instructed to present with medications and pill box to determine adherence and understanding of regimen.   Irish Elders, PharmD PGY-1 [facility] Resident  Karle Plumber, PharmD, BCPS, [facility], CPP Heart Failure Clinic Pharmacist [phone]

## 2023-02-13 NOTE — Patient Instructions (Addendum)
It was a pleasure seeing you today!  MEDICATIONS: -We are changing your medications today -Start amlodipine 10 mg once daily - Increase BiDil (isosorbide/hydralazine) 20/37.5 mg to 2 tablets three times daily -Call if you have questions about your medications.  LABS: -We will call you if your labs need attention.  NEXT APPOINTMENT: Return to HF clinic in a week  In general, to take care of your heart failure: -Limit your fluid intake to 2 Liters (half-gallon) per day.   -Limit your salt intake to ideally 2-3 grams (2000-3000 mg) per day. -Weigh yourself daily and record, and bring that "weight diary" to your next appointment.  (Weight gain of 2-3 pounds in 1 day typically means fluid weight.) -The medications for your heart are to help your heart and help you live longer.   -Please contact us before stopping any of your heart medications.  Call the clinic at [phone] with questions or to reschedule future appointments.

## 2023-02-14 ENCOUNTER — Telehealth (HOSPITAL_COMMUNITY): Payer: Self-pay | Admitting: Surgery

## 2023-02-14 NOTE — Telephone Encounter (Signed)
I called patient and left a message to indicate that she can complete ordered home sleep study and that insurance precert is not needed.

## 2023-02-20 ENCOUNTER — Inpatient Hospital Stay (HOSPITAL_COMMUNITY): Admission: RE | Admit: 2023-02-20 | Payer: BLUE CROSS/BLUE SHIELD | Source: Ambulatory Visit

## 2023-02-25 ENCOUNTER — Other Ambulatory Visit (HOSPITAL_COMMUNITY): Payer: Self-pay

## 2023-02-25 DIAGNOSIS — E1165 Type 2 diabetes mellitus with hyperglycemia: Secondary | ICD-10-CM

## 2023-02-25 MED ORDER — ATORVASTATIN CALCIUM 80 MG PO TABS
80.0000 mg | ORAL_TABLET | Freq: Every day | ORAL | 11 refills | Status: AC
  Filled 2023-02-25: qty 30, 30d supply, fill #0
  Filled 2023-04-09: qty 30, 30d supply, fill #1
  Filled 2023-09-02: qty 30, 30d supply, fill #2

## 2023-02-25 MED ORDER — SACUBITRIL-VALSARTAN 97-103 MG PO TABS
1.0000 | ORAL_TABLET | Freq: Two times a day (BID) | ORAL | 11 refills | Status: DC
  Filled 2023-02-25: qty 60, 30d supply, fill #0
  Filled 2023-04-09: qty 60, 30d supply, fill #1
  Filled 2023-09-02: qty 60, 30d supply, fill #2

## 2023-02-25 MED ORDER — SPIRONOLACTONE 25 MG PO TABS
25.0000 mg | ORAL_TABLET | Freq: Every day | ORAL | 11 refills | Status: DC
  Filled 2023-02-25: qty 30, 30d supply, fill #0
  Filled 2023-04-09: qty 30, 30d supply, fill #1
  Filled 2023-09-02: qty 30, 30d supply, fill #2

## 2023-02-25 MED ORDER — EMPAGLIFLOZIN 10 MG PO TABS
10.0000 mg | ORAL_TABLET | Freq: Every day | ORAL | 11 refills | Status: DC
  Filled 2023-02-25: qty 30, 30d supply, fill #0
  Filled 2023-04-09: qty 30, 30d supply, fill #1
  Filled 2023-09-02 – 2023-09-10 (×2): qty 30, 30d supply, fill #2

## 2023-02-25 MED ORDER — METFORMIN HCL 500 MG PO TABS
1000.0000 mg | ORAL_TABLET | Freq: Two times a day (BID) | ORAL | 0 refills | Status: DC
  Filled 2023-02-25: qty 120, 30d supply, fill #0

## 2023-02-25 NOTE — Progress Notes (Signed)
Advanced Heart Failure Clinic Note   PCP: Dr. Marijo Conception HF Cardiologist: Dr. Gasper Lloyd   HPI:  57 y.o. female w/ h/o untreated HTN, DM2, and new diagnosis of systolic heart failure.   She was admitted 11/2022 w/ new systolic heart failure. No routine medical/preventative care in recent years. BP 190s/120s in ED. She was started on IV Lasix and antihypertensive regimen. Echo showed EF 25-30%, + LV trabeculations. RV normal. LHC showed right dominant coronary circulation with no coronary artery disease. RHC demonstrated low pre and post capillary filling pressures, elevated PVR secondary to low cardiac output, severely reduced cardiac output secondary to acute systolic heart failure & hypovolemia (suspected over diuresis). Was given gentle IVF hydration back. GDMT further titrated. cMRI: LVEF 27%, RVEF 52%, Subendocardial LGE in apical lateral wall suggesting small infarct. Alternatively, sarcoidosis can also cause subendocardial LGE (though sarcoid tends to be multifocal). Findings felt to be insignificant. She was discharged home, weight 183 lbs.   Post hospital follow up 12/2022, NYHA I and volume stable. BP elevated and BiDil and carvedilol increased. Home sleep study arranged.    She returned for HF follow up on 01/16/23. Overall was feeling fine. Denied SOB. Denied palpitations, CP, dizziness, edema, or PND/Orthopnea. Appetite was ok. Weight at home 181 pounds. Out of Entresto, Lipitor and Jardiance x 3 days. Not checking BP at home. She was fasting for Ramadan at the time, not taking afternoon dose of BiDil. She was instructed to take her afternoon BiDil dose in clinic. Initial BP of 214/116 mmHg, followed by 198/110 after BiDil administration.  She returned for HF follow up on 02/13/23.  She presented with her husband for the visit. She reported that she was feeling better overall. Denied dizziness, headaches, lightheadedness and fatigue. Denied any chest pain or palpitations. Denied any SOB with  exertion. Able to complete all ADLs. Weight at home was 174 pounds. No LEE on exam. Denied PND/Orthopnea. Appetite was normal, but she had reduced her overall intake. Reported working to limit her salt intake. She brought in her pill box, medications, and blood pressure cuff for the visit. She endorsed medication adherence, however only certain days and times had a few medications inside. She administered her medications 5 minutes prior to the appointment start time. BP in clinic initially was 210/125, improved to 186/98 on repeat 15 minutes later.   Today he returns to HF clinic for pharmacist medication titration. At last visit with pharmacy clinic, Bidil was increased to 2 tablet TID and amlodipine 10 mg daily was initiated. Although she was able to pick up and start the amlodipine she forgot to increase the Bidil and has only been taking 1 tablet TID. States she feels much better today than last visit. No dizziness or lightheadedness. No CP or palpations. No SOB/DOE.  Weight at home has been stable, 174 lbs. No LEE, PND or orthopnea. Brought all her medications to her visit today. She took all her morning medications except for metformin, as she waits to take that with food. She took medications 20 minutes before clinic visit. She has a BP cuff at home but has not used it. Her daughter helps her fill her pill box and her son helped her set an alarm on her phone to remember to take her medications. BP on first check today was 192/98. I asked her to take another tablet of Bidil during the visit, and BP improved to 176/96 15 minutes later.    HF Medications: Carvedilol 6.25 mg BID Entresto 97/103 mg  BID Spironolactone 25 mg once daily Jardiance 10 mg once daily BiDil 20/37.5 2 tablet TID - has only been taking 1 tablet TID  Has the patient been experiencing any side effects to the medications prescribed?  No  Does the patient have any problems obtaining medications due to transportation or finances?    [employer] [facility] Medicaid  Understanding of regimen: poor Understanding of indications: poor Potential of compliance: poor Patient understands to avoid NSAIDs. Patient understands to avoid decongestants.    Pertinent Lab Values: 12/28/22: Serum creatinine 0.61, BUN 10, Potassium 3.6, Sodium 138, BNP 101.5  Vital Signs: Weight: 174 lbs (last clinic weight: 174 lbs) Blood pressure: 192/98, 176/96   Heart rate: 112   Assessment/Plan: Chronic Systolic Heart Failure - New. NICM - Echo (11/2022): EF 25-30%, RV ok  - No CAD on LHC, RHC w/ severely reduced cardiac index likely secondary to hypovolemia (very low filling pressures: mRA2, 23/8/15 mmHg, mPCW 3, Estimated Fick CO/CI   2.3 L/min, 1.3 L/min/m2)>>Received IVF post cath  - cMRI (11/2022): LVEF 27%, RVEF 52%, Subendocardial LGE in apical lateral wall suggesting small infarct. Alternatively, sarcoidosis can also cause subendocardial LGE (though sarcoid tends to be multifocal). Findings felt to be insignificant  - Suspect most likely HTN CM. She took her medications 20 minutes prior to the appointment start time. BP was elevated at 192/98, but decreased to 176/96 after taking 1 tablet of Bidil.  - NYHA I today - Increase carvedilol to 12.5 mg BID. - Continue Entresto 97/103 mg BID - Continue spironolactone 25 mg daily  - Continue Jardiance 10 mg daily. - Increase BiDil 37.5/20 mg to 2 tablets TID - Discussed importance of medication compliance. - Repeat echo next visit   2. Hypertension  - Recent admit w/ hypertensive urgency.  - Concern for nonadherence d/t lack of understanding of medication regimen.  - Has been referred for home sleep study - PCP planning renal u/s and aldo/renin - Increase carvedilol 6.25 mg BID. - Continue Entresto 97/103 mg BID - Continue spironolactone 25 mg daily  - Increase BiDil 37.5/20 mg to 2 tablets TID - Continue amlodipine 10 mg once daily -Long discussion  regarding importance of medication compliance and BP control today. Have asked her to record her BP at home and bring in log to next clinic visit. Have also asked her to take her medications at the same time each day. Provided medication list broken down by time of day and tablet number to help when filling pill box. May be a candidate for pill packs in the future, but they are not interested today.  -Consider referral to HTN clinic next visit. Anticipate will need to at least increase spironolactone and carvedilol as well as add chlorthalidone in the future to meet BP goal of <130/80.    3. Coronary Calcifications  - Coronary calcifications noted on chest CT - LHC 11/2022, no CAD. - No chest pain. - Continue statin.   4. Type 2DM  - New diagnosis, uncontrolled Hgb A1c 12.3 - Continue Jardiance. - Encouraged PCP follow up, was seen 12/27/22 but needs follow up    5. HLD - LDL elevated 149. LDL Goal < 70  - Continue atorvastatin 80 mg daily   6. Obesity  - Body mass index is 34.54 kg/m.  - With dx of T2DM could consider GLP1  Follow up 3 weeks with Dr. Gasper Lloyd.    Karle Plumber, PharmD, BCPS, BCCP, CPP Heart Failure Clinic Pharmacist  [phone] 

## 2023-02-25 NOTE — Telephone Encounter (Signed)
Meds ordered this encounter  Medications   empagliflozin (JARDIANCE) 10 MG TABS tablet    Sig: Take 1 tablet (10 mg total) by mouth daily.    Dispense:  30 tablet    Refill:  11   atorvastatin (LIPITOR) 80 MG tablet    Sig: Take 1 tablet (80 mg total) by mouth daily.    Dispense:  30 tablet    Refill:  11   spironolactone (ALDACTONE) 25 MG tablet    Sig: Take 1 tablet (25 mg total) by mouth daily.    Dispense:  30 tablet    Refill:  11   sacubitril-valsartan (ENTRESTO) 97-103 MG    Sig: Take 1 tablet by mouth 2 (two) times daily.    Dispense:  60 tablet    Refill:  11   metFORMIN (GLUCOPHAGE) 500 MG tablet    Sig: Take 2 tablets (1,000 mg total) by mouth 2 (two) times daily with a meal.    Dispense:  120 tablet    Refill:  0

## 2023-02-27 ENCOUNTER — Other Ambulatory Visit (HOSPITAL_COMMUNITY): Payer: Self-pay

## 2023-02-27 ENCOUNTER — Ambulatory Visit (HOSPITAL_COMMUNITY)
Admission: RE | Admit: 2023-02-27 | Discharge: 2023-02-27 | Disposition: A | Discharge status: Home / Self Care | Payer: BLUE CROSS/BLUE SHIELD | Source: Ambulatory Visit | Attending: Cardiology | Admitting: Cardiology

## 2023-02-27 VITALS — BP 176/96 | HR 112 | Wt 174.0 lb

## 2023-02-27 DIAGNOSIS — Z79899 Other long term (current) drug therapy: Secondary | ICD-10-CM | POA: Diagnosis not present

## 2023-02-27 DIAGNOSIS — E669 Obesity, unspecified: Secondary | ICD-10-CM | POA: Diagnosis not present

## 2023-02-27 DIAGNOSIS — Z6834 Body mass index (BMI) 34.0-34.9, adult: Secondary | ICD-10-CM | POA: Insufficient documentation

## 2023-02-27 DIAGNOSIS — E119 Type 2 diabetes mellitus without complications: Secondary | ICD-10-CM | POA: Insufficient documentation

## 2023-02-27 DIAGNOSIS — I110 Hypertensive heart disease with heart failure: Secondary | ICD-10-CM | POA: Insufficient documentation

## 2023-02-27 DIAGNOSIS — Z7984 Long term (current) use of oral hypoglycemic drugs: Secondary | ICD-10-CM | POA: Diagnosis not present

## 2023-02-27 DIAGNOSIS — I5022 Chronic systolic (congestive) heart failure: Secondary | ICD-10-CM | POA: Diagnosis not present

## 2023-02-27 DIAGNOSIS — I428 Other cardiomyopathies: Secondary | ICD-10-CM | POA: Insufficient documentation

## 2023-02-27 DIAGNOSIS — E785 Hyperlipidemia, unspecified: Secondary | ICD-10-CM | POA: Diagnosis not present

## 2023-02-27 MED ORDER — BIDIL 20-37.5 MG PO TABS
2.0000 | ORAL_TABLET | Freq: Three times a day (TID) | ORAL | 11 refills | Status: DC
  Filled 2023-02-27: qty 180, 30d supply, fill #0

## 2023-02-27 MED ORDER — ISOSORB DINITRATE-HYDRALAZINE 20-37.5 MG PO TABS
2.0000 | ORAL_TABLET | Freq: Three times a day (TID) | ORAL | 3 refills | Status: DC
  Filled 2023-02-27: qty 540, 90d supply, fill #0

## 2023-02-27 MED ORDER — CARVEDILOL 12.5 MG PO TABS
12.5000 mg | ORAL_TABLET | Freq: Two times a day (BID) | ORAL | 3 refills | Status: DC
  Filled 2023-02-27: qty 180, 90d supply, fill #0

## 2023-02-27 MED ORDER — ISOSORB DINITRATE-HYDRALAZINE 20-37.5 MG PO TABS
2.0000 | ORAL_TABLET | Freq: Three times a day (TID) | ORAL | 11 refills | Status: DC
  Filled 2023-02-27: qty 180, 30d supply, fill #0
  Filled 2023-04-09: qty 180, 30d supply, fill #1
  Filled 2023-09-02 – 2023-09-10 (×2): qty 180, 30d supply, fill #2
  Filled 2024-02-27: qty 180, 30d supply, fill #0

## 2023-02-27 NOTE — Patient Instructions (Signed)
It was a pleasure seeing you today!  MEDICATIONS: -We are changing your medications today -Increase Bidil (isosorbide-hydralazine) to 2 tablet three times daily -Increase carvedilol to 12.5 mg (1 tablet) twice daily -Call if you have questions about your medications.   NEXT APPOINTMENT: Return to clinic in 3 weeks with Dr. Gasper Lloyd.  In general, to take care of your heart failure: -Limit your fluid intake to 2 Liters (half-gallon) per day.   -Limit your salt intake to ideally 2-3 grams (2000-3000 mg) per day. -Weigh yourself daily and record, and bring that "weight diary" to your next appointment.  (Weight gain of 2-3 pounds in 1 day typically means fluid weight.) -The medications for your heart are to help your heart and help you live longer.   -Please contact us before stopping any of your heart medications.  Call the clinic at [phone] with questions or to reschedule future appointments.

## 2023-02-28 ENCOUNTER — Other Ambulatory Visit (HOSPITAL_COMMUNITY): Payer: Self-pay

## 2023-03-22 ENCOUNTER — Encounter (HOSPITAL_COMMUNITY): Payer: Self-pay | Admitting: Cardiology

## 2023-03-22 ENCOUNTER — Ambulatory Visit (HOSPITAL_COMMUNITY)
Admission: RE | Admit: 2023-03-22 | Discharge: 2023-03-22 | Disposition: A | Discharge status: Home / Self Care | Payer: BLUE CROSS/BLUE SHIELD | Source: Ambulatory Visit | Attending: Cardiology | Admitting: Cardiology

## 2023-03-22 ENCOUNTER — Other Ambulatory Visit (HOSPITAL_COMMUNITY): Payer: Self-pay

## 2023-03-22 ENCOUNTER — Ambulatory Visit (HOSPITAL_BASED_OUTPATIENT_CLINIC_OR_DEPARTMENT_OTHER)
Admission: RE | Admit: 2023-03-22 | Discharge: 2023-03-22 | Disposition: A | Discharge status: Home / Self Care | Payer: BLUE CROSS/BLUE SHIELD | Source: Ambulatory Visit | Attending: Cardiology | Admitting: Cardiology

## 2023-03-22 VITALS — BP 150/90 | HR 101 | Wt 176.4 lb

## 2023-03-22 DIAGNOSIS — E669 Obesity, unspecified: Secondary | ICD-10-CM | POA: Insufficient documentation

## 2023-03-22 DIAGNOSIS — Z7984 Long term (current) use of oral hypoglycemic drugs: Secondary | ICD-10-CM | POA: Insufficient documentation

## 2023-03-22 DIAGNOSIS — I1A0 Resistant hypertension: Secondary | ICD-10-CM

## 2023-03-22 DIAGNOSIS — I110 Hypertensive heart disease with heart failure: Secondary | ICD-10-CM | POA: Diagnosis not present

## 2023-03-22 DIAGNOSIS — Z6833 Body mass index (BMI) 33.0-33.9, adult: Secondary | ICD-10-CM | POA: Insufficient documentation

## 2023-03-22 DIAGNOSIS — I5022 Chronic systolic (congestive) heart failure: Secondary | ICD-10-CM

## 2023-03-22 DIAGNOSIS — E119 Type 2 diabetes mellitus without complications: Secondary | ICD-10-CM

## 2023-03-22 DIAGNOSIS — Z79899 Other long term (current) drug therapy: Secondary | ICD-10-CM | POA: Insufficient documentation

## 2023-03-22 DIAGNOSIS — E785 Hyperlipidemia, unspecified: Secondary | ICD-10-CM | POA: Diagnosis not present

## 2023-03-22 LAB — BASIC METABOLIC PANEL
Anion gap: 14 (ref 5–15)
BUN: 15 mg/dL (ref 6–20)
CO2: 23 mmol/L (ref 22–32)
Calcium: 9.8 mg/dL (ref 8.9–10.3)
Chloride: 105 mmol/L (ref 98–111)
Creatinine, Ser: 0.7 mg/dL (ref 0.44–1.00)
GFR, Estimated: >60 mL/min (ref >60)
Glucose, Bld: 129 mg/dL — ABNORMAL HIGH (ref 70–99)
Potassium: 3.7 mmol/L (ref 3.5–5.1)
Sodium: 142 mmol/L (ref 135–145)

## 2023-03-22 LAB — BRAIN NATRIURETIC PEPTIDE: B Natriuretic Peptide: 25.6 pg/mL (ref 0.0–100.0)

## 2023-03-22 MED ORDER — CARVEDILOL 25 MG PO TABS
25.0000 mg | ORAL_TABLET | Freq: Two times a day (BID) | ORAL | 3 refills | Status: DC
  Filled 2023-03-22 – 2023-04-09 (×2): qty 180, 90d supply, fill #0
  Filled 2023-09-02: qty 180, 90d supply, fill #1
  Filled 2024-02-27: qty 180, 90d supply, fill #0

## 2023-03-22 NOTE — Patient Instructions (Addendum)
INCREASE Carvedilol to 25 mg Twice daily  Labs done today, your results will be available in MyChart, we will contact you for abnormal readings.  Your physician recommends that you schedule a follow-up appointment in: 2 months  If you have any questions or concerns before your next appointment please send [place] a message through [place] or call our office at [phone].    TO LEAVE A MESSAGE FOR THE NURSE SELECT OPTION 2, PLEASE LEAVE A MESSAGE INCLUDING: YOUR NAME DATE OF BIRTH CALL BACK NUMBER REASON FOR CALL**this is important as we prioritize the call backs  YOU WILL RECEIVE A CALL BACK THE SAME DAY AS LONG AS YOU CALL BEFORE 4:00 PM  At the [facility] Clinic, you and your health needs are our priority. As part of our continuing mission to provide you with exceptional heart care, we have created designated Provider Care Teams. These Care Teams include your primary Cardiologist (physician) and Advanced Practice Providers (APPs- Physician Assistants and Nurse Practitioners) who all work together to provide you with the care you need, when you need it.   You may see any of the following providers on your designated Care Team at your next follow up: Dr Arvilla Meres Dr Marca Ancona Dr. Marcos Eke, NP Robbie Lis, [place] [facility] [place], [place] Brynda Peon, NP Karle Plumber, PharmD   Please be sure to bring in all your medications bottles to every appointment.    Thank you for choosing [facility] [place] [facility]

## 2023-03-22 NOTE — Progress Notes (Signed)
ADVANCED HEART FAILURE CLINIC NOTE  Referring Physician: Dorthula Nettles, DO  Primary Care: Pcp, No Primary Cardiologist:  HPI: 57 y.o. female w/ h/o untreated HTN, DM2, and new diagnosis of systolic heart failure.    She was admitted 2/24 w/ new systolic heart failure. No routine medical/preventative care in recent years. BP 190s/120s in ED. She was started on IV Lasix and antihypertensive regimen. Echo showed EF, 25-30%, + LV trabeculations. RV normal. LHC showed right dominant coronary circulation with no coronary artery disease. RHC demonstrated low pre and post capillary filling pressures, elevated PVR secondary to low cardiac output, severely reduced cardiac output secondary to acute systolic heart failure & hypovolemia (suspected over diuresis). Was given gentle IVF hydration back. GDMT further titrated. cMRI: LVEF 27%, RVEF 52%, Subendocardial LGE in apical lateral wall suggesting small infarct. Alternatively, sarcoidosis can also cause subendocardial LGE (though sarcoid tends to be multifocal). Findings felt to be insignificant. She was discharged home, weight 183 lbs.   Post hospital follow up 3/24, NYHA I and volume stable. BP elevated and BiDil and Coreg increased. Home sleep study arranged.   Today she presents for follow up. From a functional standpoint she has been doing very well. She reports feeling better than she has in months. No PND, SOB, orthopnea or LE edema. Complaint with all medications.   Activity level/exercise tolerance:  NYHA II Orthopnea:  Sleeps on 2 pillows Paroxysmal noctural dyspnea:  No Chest pain/pressure:  No Orthostatic lightheadedness:  No Palpitations:  No Lower extremity edema:  No Presyncope/syncope:  No Cough:  No  Past Medical History:  Diagnosis Date   DM2 (diabetes mellitus, type 2) (HCC)    HTN (hypertension)     Current Outpatient Medications  Medication Sig Dispense Refill   Accu-Chek Softclix Lancets lancets Use as directed up  to 4 times daily 100 each 0   amLODipine (NORVASC) 10 MG tablet Take 1 tablet (10 mg total) by mouth daily. 30 tablet 11   atorvastatin (LIPITOR) 80 MG tablet Take 1 tablet (80 mg total) by mouth daily. 30 tablet 11   blood glucose meter kit and supplies Dispense based on patient and insurance preference. Use up to four times daily as directed. (FOR ICD-10 E10.9, E11.9). 1 each 0   Blood Glucose Monitoring Suppl (ACCU-CHEK GUIDE) w/Device KIT USE AS DIRECTED 1 kit 0   carvedilol (COREG) 12.5 MG tablet Take 1 tablet (12.5 mg total) by mouth 2 (two) times daily. 180 tablet 3   empagliflozin (JARDIANCE) 10 MG TABS tablet Take 1 tablet (10 mg total) by mouth daily. 30 tablet 11   glucose blood test strip Use as directed 100 each 0   isosorbide-hydrALAZINE (BIDIL) 20-37.5 MG tablet Take 2 tablets by mouth 3 (three) times daily. 180 tablet 11   metFORMIN (GLUCOPHAGE) 500 MG tablet Take 2 tablets (1,000 mg total) by mouth 2 (two) times daily with a meal. 120 tablet 0   sacubitril-valsartan (ENTRESTO) 97-103 MG Take 1 tablet by mouth 2 (two) times daily. 60 tablet 11   spironolactone (ALDACTONE) 25 MG tablet Take 1 tablet (25 mg total) by mouth daily. 30 tablet 11   No current facility-administered medications for this encounter.    Not on File    Social History   Socioeconomic History   Marital status: Married    Spouse name: IBrahim   Number of children: Not on file   Years of education: Not on file   Highest education level: Not on file  Occupational History  Not on file  Tobacco Use   Smoking status: Never   Smokeless tobacco: Never  Vaping Use   Vaping Use: Never used  Substance and Sexual Activity   Alcohol use: Never   Drug use: Never   Sexual activity: Yes  Other Topics Concern   Not on file  Social History Narrative   Not on file   Social Determinants of Health   Financial Resource Strain: High Risk (12/10/2022)   Overall Financial Resource Strain (CARDIA)     Difficulty of Paying Living Expenses: Hard  Food Insecurity: No Food Insecurity (12/27/2022)   Hunger Vital Sign    Worried About Running Out of Food in the Last Year: Never true    Ran Out of Food in the Last Year: Never true  Transportation Needs: No Transportation Needs (12/10/2022)   PRAPARE - Administrator, Civil Service (Medical): No    Lack of Transportation (Non-Medical): No  Physical Activity: Not on file  Stress: Not on file  Social Connections: Not on file  Intimate Partner Violence: Not At Risk (12/27/2022)   Humiliation, Afraid, Rape, and Kick questionnaire    Fear of Current or Ex-Partner: No    Emotionally Abused: No    Physically Abused: No    Sexually Abused: No     History reviewed. No pertinent family history.  PHYSICAL EXAM: Vitals:   03/22/23 1456  BP: (!) 150/90  Pulse: (!) 101  SpO2: 99%   GENERAL: Well nourished, well developed, and in no apparent distress at rest.  HEENT: Negative for arcus senilis or xanthelasma. There is no scleral icterus.  The mucous membranes are pink and moist.   NECK: Supple, No masses. Normal carotid upstrokes without bruits. No masses or thyromegaly.    CHEST: There are no chest wall deformities. There is no chest wall tenderness. Respirations are unlabored.  Lungs- CTA B/L CARDIAC:  JVP: 7 cm H2O         Normal S1, S2  Normal rate with regular rhythm. No murmurs, rubs or gallops.  Pulses are 2+ and symmetrical in upper and lower extremities. No edema.  ABDOMEN: Soft, non-tender, non-distended. There are no masses or hepatomegaly. There are normal bowel sounds.  EXTREMITIES: Warm and well perfused with no cyanosis, clubbing.  LYMPHATIC: No axillary or supraclavicular lymphadenopathy.  NEUROLOGIC: Patient is oriented x3 with no focal or lateralizing neurologic deficits.  PSYCH: Patients affect is appropriate, there is no evidence of anxiety or depression.  SKIN: Warm and dry; no lesions or wounds.   DATA  REVIEW  ECG: 01/16/23: sinus tachycardia  As per my personal interpretation  ECHO: 12/09/22: LVEF 25-30%, normal RV function as per my read. 03/22/23: LVEF 55-60%, normal RV function as per my personal interpretation  CMR: 12/12/22: 1. Normal LV size, mild hypertrophy, and severe systolic dysfunction (EF 27%)  2.  Small RV size with normal systolic function (EF 52%)  3. Subendocardial LGE in apical lateral wall suggesting small infarct. Alternatively, sarcoidosis can also cause subendocardial LGE (though sarcoid tends to be multifocal)  CATH: 12/12/22 HEMODYNAMICS: RA:                  2 mmHg (mean) RV:                  16/2 mmHg PA:                  23/8 mmHg (15 mean) PCWP:  3 mmHg (mean)                                      Estimated Fick CO/CI   2.3 L/min, 1.3 L/min/m2                                              TPG                 12  mmHg                                            PVR                 5.2 Wood Units  PAPi                7.5       IMPRESSION: 1.Low pre and post capillary filling pressures 2.Elevated PVR secondary to low cardiac output.  3.Severely reduced cardiac output secondary to acute systolic heart failure & hypovolemia.  4. Right dominant coronary circulation with no coronary artery disease   ASSESSMENT & PLAN:  Heart failure with reduced EF Etiology of ZO:[id number] cardiomyopathy; improvement in LVEF now w/ improved BP control NYHA class / AHA Stage:II Volume status & Diuretics: lasix 40mg  PRN only Vasodilators:entresto 97/103mg  BID, Bidil 2 tabs TID, amlodipine 10mg   Beta-Blocker:coreg 25mg  BID XBJ:[id number] 25mg  daily Cardiometabolic:jardiance 10mg  daily  Devices therapies & Valvulopathies:not indicated, improved LVEF today by TTE Advanced therapies:not indicated.   2. Uncontrolled HTN - Increase coreg to 25mg  BID  - At follow up will start evaluation for resistant HTN: renal dopplers, etc.  - Refer to HTN clinic  3.  T2DM - A1C of 12.3 - jardiance 10mg  - PCP following   4. Hyperlipidemia  - LDL 149 - lipitor 80mg   - repeat lipid panel at follow up.   5. Obesity -Body mass index is 33.33 kg/m. -discussed importance of nutrition / weight loss  Branden Shallenberger Advanced Heart Failure Mechanical Circulatory Support

## 2023-03-22 NOTE — Progress Notes (Signed)
  Echocardiogram 2D Echocardiogram has been performed.  Maren Reamer 03/22/2023, 2:57 PM

## 2023-03-25 LAB — ECHOCARDIOGRAM COMPLETE
Area-P 1/2: 3.46 cm2
MV VTI: 3.82 cm2
S' Lateral: 3 cm

## 2023-04-02 ENCOUNTER — Encounter: Payer: BLUE CROSS/BLUE SHIELD | Admitting: Dietician

## 2023-04-02 ENCOUNTER — Telehealth: Payer: Self-pay | Admitting: Dietician

## 2023-04-02 NOTE — Telephone Encounter (Signed)
Reschedule patient to June 12 at 115 Pm for diabetes education. She is also due to see her doctor for follow up and A1c.

## 2023-04-04 ENCOUNTER — Other Ambulatory Visit (HOSPITAL_COMMUNITY): Payer: Self-pay

## 2023-04-09 ENCOUNTER — Other Ambulatory Visit (HOSPITAL_COMMUNITY): Payer: Self-pay

## 2023-04-10 ENCOUNTER — Other Ambulatory Visit (HOSPITAL_COMMUNITY): Payer: Self-pay

## 2023-04-10 ENCOUNTER — Other Ambulatory Visit: Payer: Self-pay

## 2023-04-10 ENCOUNTER — Ambulatory Visit: Payer: BLUE CROSS/BLUE SHIELD | Admitting: Dietician

## 2023-04-10 ENCOUNTER — Ambulatory Visit (INDEPENDENT_AMBULATORY_CARE_PROVIDER_SITE_OTHER): Payer: BLUE CROSS/BLUE SHIELD | Admitting: Student

## 2023-04-10 VITALS — Wt 179.3 lb

## 2023-04-10 DIAGNOSIS — I5022 Chronic systolic (congestive) heart failure: Secondary | ICD-10-CM | POA: Diagnosis not present

## 2023-04-10 DIAGNOSIS — Z7984 Long term (current) use of oral hypoglycemic drugs: Secondary | ICD-10-CM | POA: Diagnosis not present

## 2023-04-10 DIAGNOSIS — Z713 Dietary counseling and surveillance: Secondary | ICD-10-CM

## 2023-04-10 DIAGNOSIS — E1165 Type 2 diabetes mellitus with hyperglycemia: Secondary | ICD-10-CM

## 2023-04-10 DIAGNOSIS — I110 Hypertensive heart disease with heart failure: Secondary | ICD-10-CM | POA: Diagnosis not present

## 2023-04-10 DIAGNOSIS — I10 Essential (primary) hypertension: Secondary | ICD-10-CM

## 2023-04-10 LAB — POCT GLYCOSYLATED HEMOGLOBIN (HGB A1C): Hemoglobin A1C: 8.2 % — AB (ref 4.0–5.6)

## 2023-04-10 LAB — GLUCOSE, CAPILLARY: Glucose-Capillary: 150 mg/dL — ABNORMAL HIGH (ref 70–99)

## 2023-04-10 NOTE — Patient Instructions (Addendum)
Please make a follow up with Norm Parcel on the same day you return to see the doctor.   Hemoglobin A1C Test Why am I having this test? You may have the hemoglobin A1C test (A1C test) done to: Check the long-term control of blood sugar (glucose) in people who have diabetes and help make treatment decisions.  What is being tested? Hemoglobin is a type of protein in the blood that carries oxygen. Glucose attaches to hemoglobin to form glycated hemoglobin. This test checks the amount of glycated hemoglobin in your blood. This is a good indicator of the average amount of glucose in your blood during the past 2-3 months.  For this test, common reference ranges are: Adult or child without diabetes: 4-5.6%. Adult or child with prediabetes: 5.7%-6.4%. Adult or child with diabetes: 6.5% or higher. What do the results mean?  If you have diabetes: A result of 7% usually means that your diabetes is under control. A result of less than 7% also means that diabetes is under control. This result may be an appropriate goal for those for whom there is a low risk of low blood sugar (hypoglycemia). A result of less than 8% may also mean that diabetes is under control. This result may be an appropriate goal for those who do not benefit from more blood glucose control or who are at high risk for hypoglycemia.  Talk with your health care provider about what your results mean.  Food (especially starchy and sugary foods like bread, fufu, rice, potatoes and fruit) raises blood sugar   Activity lowers blood sugar  Blood sugar goals 80-180 Before meal 80-130 After meals 80-180  Healthy food choices:  Eat more...  Whole grains: whole grain cereal, whole wheat crackers, whole wheat bread and pasta, old fashioned oats & brown rice Whole fruits-1 cup a day Vegetables- 2-3 cups a day Lowfat Protein: Beans, chicken, [place], yogurt, lean cuts of beef, fish 2-3 times a week Nuts, Seeds and Soy: walnuts, peanuts,  pecans, peanut butter  Foods with omega-3 fatty acids- salmon, tuna, flax seeds, chia seeds Foods with polyphenols (berries, apples, citrus, dark chocolate, tea, coffee)  Eat Less... Refined carb foods (white bread, white rice, some cereals, cake, candy) ?Sugar-sweetened drinks(soda, juice, sweet tea) ?Red meat - beef ?Processed meats (bacon, sausage, cold cuts) ?Margarine, shortening, lard ?Ultra-processed foods (packaged snacks, poultry nuggets, instant noodles, candy, additives)

## 2023-04-10 NOTE — Patient Instructions (Signed)
Thank you so much for coming to the clinic today!   I'm glad you're doing well. I want you to keep track of your blood pressure with the form, and I would like to see you back in a month with those readings at home. If you have any concerns please let us know. Thank you.   If you have any questions please feel free to the call the clinic at anytime at [phone]. It was a pleasure seeing you!  Best, Dr. Thomasene Ripple

## 2023-04-10 NOTE — Assessment & Plan Note (Signed)
Patient presents for follow-up regarding her type 2 diabetes.  Previous A1c in March was 12.3, now is currently 8.2.  She has changed her diet, and has incorporated regular exercise into her routine.  The current regimen for her is Jardiance 10 mg, as well as metformin at 1000 mg twice a day.  I looked over her glucometer printout, and average glucose is 168, and lowest she has gone is 151.  She has stayed within her target zone 96% of the time.  Overall, she is doing well in this matter and will not make any changes to her regimen she is comfortable with her current routine.  Plan: - Continue Jardiance 10 mg - Continue metformin at 1000 mg twice daily - Checking urine albumin creatinine ratio

## 2023-04-10 NOTE — Assessment & Plan Note (Signed)
Last echo showed ejection fraction of 40 to 45%, with left ventricular global hypokinesis.  She is on GDMT management with Entresto, Coreg, spironolactone, and Jardiance.  She follows with heart failure team, who are actively managing.  She is euvolemic on my exam, with no lower extremity edema as well as no JVP appreciable.  Overall doing well with her medical regimen.  Next appointment with heart failure team is in August 2024.

## 2023-04-10 NOTE — Progress Notes (Signed)
Diabetes Self-Management Education  Visit Type: First/Initial  Appt. Start Time: 1320 Appt. End Time: 1410  04/10/2023  [name], identified by name and date of birth, is a 57 y.o. female with a diagnosis of Diabetes: Type 2.   ASSESSMENT  Weight 179 lb 4.8 oz (81.3 kg). Body mass index is 33.88 kg/m. Lab Results  Component Value Date   HGBA1C 8.2 (A) 04/10/2023   HGBA1C 12.3 (H) 12/09/2022    BP Readings from Last 3 Encounters:  03/22/23 (!) 150/90  02/27/23 (!) 176/96  02/13/23 (!) 186/98      Diabetes Self-Management Education - 04/10/23 1300       Visit Information   Visit Type First/Initial      Initial Visit   Diabetes Type Type 2    Date Diagnosed 11/2022   States was checked for diabetes at age 80 and did not have it. both parents have diabetes   Are you currently following a meal plan? No   says she does not know what to eat   Are you taking your medications as prescribed? Yes      Psychosocial Assessment   Patient Belief/Attitude about Diabetes Motivated to manage diabetes    What is the hardest part about your diabetes right now, causing you the most concern, or is the most worrisome to you about your diabetes?   Making healty food and beverage choices;Being active    Self-care barriers English as a second language;Lack of material resources    Self-management support Doctor's office;Family;CDE visits    Other persons present Spouse/SO    Patient Concerns Nutrition/Meal planning    Special Needs None    Preferred Learning Style No preference indicated    Learning Readiness Ready    How often do you need to have someone help you when you read instructions, pamphlets, or other written materials from your doctor or pharmacy? 3 - Sometimes    What is the last grade level you completed in school? need to assess at future visit      Pre-Education Assessment   Patient understands the diabetes disease and treatment process. --   need to assess  at future visits   Patient understands incorporating nutritional management into lifestyle. Needs Instruction    Patient undertands incorporating physical activity into lifestyle. Needs Instruction    Patient understands using medications safely. --   need to assess at future visits   Patient understands monitoring blood glucose, interpreting and using results Needs Instruction    Patient understands prevention, detection, and treatment of acute complications. Needs Instruction    Patient understands prevention, detection, and treatment of chronic complications. --   need to assess at future visits   Patient understands how to develop strategies to address psychosocial issues. Comprehends key points    Patient understands how to develop strategies to promote health/change behavior. --   need to assess at future visits     Complications   Last HgB A1C per patient/outside source 8.2 %    How often do you check your blood sugar? 3-4 times / week   5x/month for the last 30 days   Fasting Blood glucose range (mg/dL) 161-096    Postprandial Blood glucose range (mg/dL) 045-409;>811    Number of hypoglycemic episodes per month 0    Number of hyperglycemic episodes ( >200mg /dL): Weekly    Can you tell when your blood sugar is high? No    Have you had a dilated eye exam in the  past 12 months? No    Have you had a dental exam in the past 12 months? No    Are you checking your feet? No      Dietary Intake   Breakfast wakes late    Snack (morning) 10 am coffee with milk, bread and beans    Lunch 2- chicken or fish, fuf and vegetable soup   she states she eats small portion of starches and lunch is her largest meal. However, she is not very hungry and does not recall experiencing signs and sympotons of high blood sugar   Dinner 5-6 same as lunch    Beverage(s) wtaer, tea- hibiscus herbal, coffee, milk      Activity / Exercise   Activity / Exercise Type --   need to assess at future visits      Patient Education   Previous Diabetes Education No    Healthy Eating Role of diet in the treatment of diabetes and the relationship between the three main macronutrients and blood glucose level;Plate Method;Reviewed blood glucose goals for pre and post meals and how to evaluate the patients' food intake on their blood glucose level.;Meal timing in regards to the patients' current diabetes medication.    Being Active Role of exercise on diabetes management, blood pressure control and cardiac health.    Monitoring Identified appropriate SMBG and/or A1C goals.;Taught/discussed recording of test results and interpretation of SMBG.    Acute complications Discussed and identified patients' prevention, symptoms, and treatment of hyperglycemia.      Individualized Goals (developed by patient)   Nutrition --   need to assess at future visits     Post-Education Assessment   Patient understands the diabetes disease and treatment process. --   did not set goals today, except to schedule a follow up visit     Outcomes   Expected Outcomes Demonstrated interest in learning. Expect positive outcomes    Future DMSE 4-6 wks    Program Status Not Completed             Individualized Plan for Diabetes Self-Management Training:   Learning Objective:  Patient will have a greater understanding of diabetes self-management. Patient education plan is to attend individual and/or group sessions per assessed needs and concerns.   Plan:   Patient Instructions  Please make a follow up with Norm Parcel on the same day you return to see the doctor.   Hemoglobin A1C Test Why am I having this test? You may have the hemoglobin A1C test (A1C test) done to: Check the long-term control of blood sugar (glucose) in people who have diabetes and help make treatment decisions.  What is being tested? Hemoglobin is a type of protein in the blood that carries oxygen. Glucose attaches to hemoglobin to form glycated  hemoglobin. This test checks the amount of glycated hemoglobin in your blood. This is a good indicator of the average amount of glucose in your blood during the past 2-3 months.  For this test, common reference ranges are: Adult or child without diabetes: 4-5.6%. Adult or child with prediabetes: 5.7%-6.4%. Adult or child with diabetes: 6.5% or higher. What do the results mean?  If you have diabetes: A result of 7% usually means that your diabetes is under control. A result of less than 7% also means that diabetes is under control. This result may be an appropriate goal for those for whom there is a low risk of low blood sugar (hypoglycemia). A result of less than 8%  may also mean that diabetes is under control. This result may be an appropriate goal for those who do not benefit from more blood glucose control or who are at high risk for hypoglycemia.  Talk with your health care provider about what your results mean.  Food (especially starchy and sugary foods like bread, fufu, rice, potatoes and fruit) raises blood sugar   Activity lowers blood sugar  Blood sugar goals 80-180 Before meal 80-130 After meals 80-180  Healthy food choices:  Eat more...  Whole grains: whole grain cereal, whole wheat crackers, whole wheat bread and pasta, old fashioned oats & brown rice Whole fruits-1 cup a day Vegetables- 2-3 cups a day Lowfat Protein: Beans, chicken, [place], yogurt, lean cuts of beef, fish 2-3 times a week Nuts, Seeds and Soy: walnuts, peanuts, pecans, peanut butter  Foods with omega-3 fatty acids- salmon, tuna, flax seeds, chia seeds Foods with polyphenols (berries, apples, citrus, dark chocolate, tea, coffee)  Eat Less... Refined carb foods (white bread, white rice, some cereals, cake, candy) ?Sugar-sweetened drinks(soda, juice, sweet tea) ?Red meat - beef ?Processed meats (bacon, sausage, cold cuts) ?Margarine, shortening, lard ?Ultra-processed foods (packaged snacks, poultry  nuggets, instant noodles, candy, additives)    Expected Outcomes:  Demonstrated interest in learning. Expect positive outcomes  Education material provided: My Plate and Diabetes Resources  If problems or questions, patient to contact team via:  Phone  Future DSME appointment: 4-6 wks Norm Parcel, RD 04/10/2023 3:04 PM.

## 2023-04-10 NOTE — Progress Notes (Signed)
CC: Diabetes/hypertension follow-up  HPI:  [name] is a 57 y.o. female living with a history stated below and presents today for diabetes/hypertension follow-up. Please see problem based assessment and plan for additional details.  Past Medical History:  Diagnosis Date   DM2 (diabetes mellitus, type 2) (HCC)    HTN (hypertension)     Current Outpatient Medications on File Prior to Visit  Medication Sig Dispense Refill   Accu-Chek Softclix Lancets lancets Use as directed up to 4 times daily 100 each 0   amLODipine (NORVASC) 10 MG tablet Take 1 tablet (10 mg total) by mouth daily. 30 tablet 11   atorvastatin (LIPITOR) 80 MG tablet Take 1 tablet (80 mg total) by mouth daily. 30 tablet 11   blood glucose meter kit and supplies Dispense based on patient and insurance preference. Use up to four times daily as directed. (FOR ICD-10 E10.9, E11.9). 1 each 0   Blood Glucose Monitoring Suppl (ACCU-CHEK GUIDE) w/Device KIT USE AS DIRECTED 1 kit 0   carvedilol (COREG) 25 MG tablet Take 1 tablet (25 mg total) by mouth 2 (two) times daily. 180 tablet 3   empagliflozin (JARDIANCE) 10 MG TABS tablet Take 1 tablet (10 mg total) by mouth daily. 30 tablet 11   glucose blood test strip Use as directed 100 each 0   isosorbide-hydrALAZINE (BIDIL) 20-37.5 MG tablet Take 2 tablets by mouth 3 (three) times daily. 180 tablet 11   metFORMIN (GLUCOPHAGE) 500 MG tablet Take 2 tablets (1,000 mg total) by mouth 2 (two) times daily with a meal. 120 tablet 0   sacubitril-valsartan (ENTRESTO) 97-103 MG Take 1 tablet by mouth 2 (two) times daily. 60 tablet 11   spironolactone (ALDACTONE) 25 MG tablet Take 1 tablet (25 mg total) by mouth daily. 30 tablet 11   No current facility-administered medications on file prior to visit.    No family history on file.  Social History   Socioeconomic History   Marital status: Married    Spouse name: Ruth Edwards   Number of children: Not on file   Years of  education: Not on file   Highest education level: Not on file  Occupational History   Not on file  Tobacco Use   Smoking status: Never   Smokeless tobacco: Never  Vaping Use   Vaping Use: Never used  Substance and Sexual Activity   Alcohol use: Never   Drug use: Never   Sexual activity: Yes  Other Topics Concern   Not on file  Social History Narrative   Not on file   Social Determinants of Health   Financial Resource Strain: High Risk (12/10/2022)   Overall Financial Resource Strain (CARDIA)    Difficulty of Paying Living Expenses: Hard  Food Insecurity: No Food Insecurity (12/27/2022)   Hunger Vital Sign    Worried About Running Out of Food in the Last Year: Never true    Ran Out of Food in the Last Year: Never true  Transportation Needs: No Transportation Needs (12/10/2022)   PRAPARE - Administrator, Civil Service (Medical): No    Lack of Transportation (Non-Medical): No  Physical Activity: Not on file  Stress: Not on file  Social Connections: Not on file  Intimate Partner Violence: Not At Risk (12/27/2022)   Humiliation, Afraid, Rape, and Kick questionnaire    Fear of Current or Ex-Partner: No    Emotionally Abused: No    Physically Abused: No    Sexually Abused: No  Review of Systems: ROS negative except for what is noted on the assessment and plan.  There were no vitals filed for this visit.  Physical Exam: Constitutional: well-appearing female in no acute distress HENT: normocephalic atraumatic, mucous membranes moist Eyes: conjunctiva non-erythematous Neck: supple Cardiovascular: regular rate and rhythm, no m/r/g, no JVP, no lower extremity edema Pulmonary/Chest: normal work of breathing on room air, lungs clear to auscultation bilaterally   Assessment & Plan:   Uncontrolled type 2 diabetes mellitus with hyperglycemia, without long-term current use of insulin (HCC) Patient presents for follow-up regarding her type 2 diabetes.  Previous A1c  in March was 12.3, now is currently 8.2.  She has changed her diet, and has incorporated regular exercise into her routine.  The current regimen for her is Jardiance 10 mg, as well as metformin at 1000 mg twice a day.  I looked over her glucometer printout, and average glucose is 168, and lowest she has gone is 151.  She has stayed within her target zone 96% of the time.  Overall, she is doing well in this matter and will not make any changes to her regimen she is comfortable with her current routine.  Plan: - Continue Jardiance 10 mg - Continue metformin at 1000 mg twice daily - Checking urine albumin creatinine ratio  Essential hypertension Patient also here for follow-up regarding her hypertension.  Blood pressure initially was 176, on recheck increased to 182.  Her regimen is supposed to include Entresto 97/103 twice a day, bidil 3 times a day, amlodipine 10 mg, Coreg 20 mg twice a day, spironolactone 25 mg.  She has been out of her bottle and spironolactone for the last couple days.  She does check her blood pressure at home occasionally, and less than she did check it at home systolic was in the 150 range.  I encouraged her to check her blood pressure more consistently, and provided her with a blood pressure log to bring in for her next appointment.  She has refills waiting for her in the pharmacy.  Plan: - Will continue current regimen, and reevaluate in a month when patient is consistently taking all of her medications - If blood pressure still not controlled, can consider secondary hypertension workup  Chronic systolic heart failure (HCC) Last echo showed ejection fraction of 40 to 45%, with left ventricular global hypokinesis.  She is on GDMT management with Entresto, Coreg, spironolactone, and Jardiance.  She follows with heart failure team, who are actively managing.  She is euvolemic on my exam, with no lower extremity edema as well as no JVP appreciable.  Overall doing well with her  medical regimen.  Next appointment with heart failure team is in August 2024.  Patient discussed with Dr. Rockey Situ, M.D. [facility], PGY-1 Pager: [phone] Date 04/10/2023 Time 3:25 PM

## 2023-04-10 NOTE — Assessment & Plan Note (Addendum)
Patient also here for follow-up regarding her hypertension.  Blood pressure initially was 176, on recheck increased to 182.  Her regimen is supposed to include Entresto 97/103 twice a day, bidil 3 times a day, amlodipine 10 mg, Coreg 20 mg twice a day, spironolactone 25 mg.  She has been out of her bottle and spironolactone for the last couple days.  She does check her blood pressure at home occasionally, and less than she did check it at home systolic was in the 150 range.  I encouraged her to check her blood pressure more consistently, and provided her with a blood pressure log to bring in for her next appointment.  She has refills waiting for her in the pharmacy.  Plan: - Will continue current regimen, and reevaluate in a month when patient is consistently taking all of her medications - If blood pressure still not controlled, can consider secondary hypertension workup

## 2023-04-11 LAB — MICROALBUMIN / CREATININE URINE RATIO
Creatinine, Urine: 97.9 mg/dL
Microalb/Creat Ratio: 40 mg/g creat — ABNORMAL HIGH (ref 0–29)
Microalbumin, Urine: 39.3 ug/mL

## 2023-04-13 NOTE — Progress Notes (Signed)
Internal Medicine Clinic Attending  Case discussed with the resident at the time of the visit.  We reviewed the resident's history and exam and pertinent patient test results.  I agree with the assessment, diagnosis, and plan of care documented in the resident's note.  

## 2023-06-03 ENCOUNTER — Encounter (HOSPITAL_COMMUNITY): Payer: BLUE CROSS/BLUE SHIELD | Admitting: Cardiology

## 2023-08-01 ENCOUNTER — Other Ambulatory Visit (HOSPITAL_COMMUNITY): Payer: Self-pay

## 2023-09-02 ENCOUNTER — Other Ambulatory Visit (HOSPITAL_COMMUNITY): Payer: Self-pay

## 2023-09-02 ENCOUNTER — Other Ambulatory Visit (HOSPITAL_COMMUNITY): Payer: Self-pay | Admitting: Family Medicine

## 2023-09-02 DIAGNOSIS — E1165 Type 2 diabetes mellitus with hyperglycemia: Secondary | ICD-10-CM

## 2023-09-03 ENCOUNTER — Other Ambulatory Visit: Payer: Self-pay

## 2023-09-09 ENCOUNTER — Other Ambulatory Visit (HOSPITAL_COMMUNITY): Payer: Self-pay

## 2023-09-10 ENCOUNTER — Other Ambulatory Visit (HOSPITAL_COMMUNITY): Payer: Self-pay

## 2023-09-10 ENCOUNTER — Other Ambulatory Visit: Payer: Self-pay

## 2023-09-11 ENCOUNTER — Other Ambulatory Visit (HOSPITAL_COMMUNITY): Payer: Self-pay

## 2023-09-11 ENCOUNTER — Telehealth (HOSPITAL_COMMUNITY): Payer: Self-pay

## 2023-09-11 ENCOUNTER — Other Ambulatory Visit: Payer: Self-pay

## 2023-09-11 NOTE — Telephone Encounter (Signed)
Patient Advocate Encounter  Prior authorization for [place] has been submitted and approved. Test billing returns $4 for 90 day supply.  [employer] Effective: 09/11/2023 to 09/10/2024  Burnell Blanks, CPhT Rx Patient Advocate Phone: [phone]

## 2023-09-23 ENCOUNTER — Other Ambulatory Visit (HOSPITAL_COMMUNITY): Payer: Self-pay

## 2024-02-25 ENCOUNTER — Other Ambulatory Visit: Payer: Self-pay

## 2024-02-25 ENCOUNTER — Encounter (HOSPITAL_COMMUNITY): Payer: Self-pay

## 2024-02-25 ENCOUNTER — Observation Stay (HOSPITAL_COMMUNITY)
Admission: EM | Admit: 2024-02-25 | Discharge: 2024-02-26 | Disposition: A | Discharge status: Home / Self Care | Attending: Infectious Diseases | Admitting: Infectious Diseases

## 2024-02-25 ENCOUNTER — Emergency Department (HOSPITAL_COMMUNITY)

## 2024-02-25 DIAGNOSIS — Z7984 Long term (current) use of oral hypoglycemic drugs: Secondary | ICD-10-CM | POA: Insufficient documentation

## 2024-02-25 DIAGNOSIS — Z79899 Other long term (current) drug therapy: Secondary | ICD-10-CM | POA: Insufficient documentation

## 2024-02-25 DIAGNOSIS — E785 Hyperlipidemia, unspecified: Secondary | ICD-10-CM | POA: Diagnosis not present

## 2024-02-25 DIAGNOSIS — I5020 Unspecified systolic (congestive) heart failure: Secondary | ICD-10-CM | POA: Diagnosis not present

## 2024-02-25 DIAGNOSIS — R0602 Shortness of breath: Secondary | ICD-10-CM | POA: Diagnosis present

## 2024-02-25 DIAGNOSIS — E119 Type 2 diabetes mellitus without complications: Secondary | ICD-10-CM | POA: Diagnosis not present

## 2024-02-25 DIAGNOSIS — E876 Hypokalemia: Secondary | ICD-10-CM | POA: Diagnosis not present

## 2024-02-25 DIAGNOSIS — I110 Hypertensive heart disease with heart failure: Secondary | ICD-10-CM | POA: Insufficient documentation

## 2024-02-25 DIAGNOSIS — E1165 Type 2 diabetes mellitus with hyperglycemia: Secondary | ICD-10-CM

## 2024-02-25 DIAGNOSIS — I5021 Acute systolic (congestive) heart failure: Principal | ICD-10-CM | POA: Insufficient documentation

## 2024-02-25 DIAGNOSIS — I160 Hypertensive urgency: Secondary | ICD-10-CM | POA: Diagnosis not present

## 2024-02-25 DIAGNOSIS — I509 Heart failure, unspecified: Secondary | ICD-10-CM

## 2024-02-25 DIAGNOSIS — Z794 Long term (current) use of insulin: Secondary | ICD-10-CM

## 2024-02-25 DIAGNOSIS — E1159 Type 2 diabetes mellitus with other circulatory complications: Secondary | ICD-10-CM

## 2024-02-25 HISTORY — DX: Hypertensive urgency: I16.0

## 2024-02-25 HISTORY — DX: Heart failure, unspecified: I50.9

## 2024-02-25 LAB — GLUCOSE, CAPILLARY
Glucose-Capillary: 166 mg/dL — ABNORMAL HIGH (ref 70–99)
Glucose-Capillary: 196 mg/dL — ABNORMAL HIGH (ref 70–99)

## 2024-02-25 LAB — HEMOGLOBIN A1C
Hgb A1c MFr Bld: 11.3 % — ABNORMAL HIGH (ref 4.8–5.6)
Mean Plasma Glucose: 277.61 mg/dL

## 2024-02-25 LAB — CBC
HCT: 37.7 % (ref 36.0–46.0)
Hemoglobin: 12.4 g/dL (ref 12.0–15.0)
MCH: 26.3 pg (ref 26.0–34.0)
MCHC: 32.9 g/dL (ref 30.0–36.0)
MCV: 80 fL (ref 80.0–100.0)
Platelets: 230 10*3/uL (ref 150–400)
RBC: 4.71 MIL/uL (ref 3.87–5.11)
RDW: 13.6 % (ref 11.5–15.5)
WBC: 6.8 10*3/uL (ref 4.0–10.5)
nRBC: 0 % (ref 0.0–0.2)

## 2024-02-25 LAB — COMPREHENSIVE METABOLIC PANEL WITH GFR
ALT: 18 U/L (ref 0–44)
AST: 25 U/L (ref 15–41)
Albumin: 2.8 g/dL — ABNORMAL LOW (ref 3.5–5.0)
Alkaline Phosphatase: 72 U/L (ref 38–126)
Anion gap: 10 (ref 5–15)
BUN: 10 mg/dL (ref 6–20)
CO2: 25 mmol/L (ref 22–32)
Calcium: 8.7 mg/dL — ABNORMAL LOW (ref 8.9–10.3)
Chloride: 101 mmol/L (ref 98–111)
Creatinine, Ser: 0.57 mg/dL (ref 0.44–1.00)
GFR, Estimated: >60 mL/min (ref >60)
Glucose, Bld: 328 mg/dL — ABNORMAL HIGH (ref 70–99)
Potassium: 3.7 mmol/L (ref 3.5–5.1)
Sodium: 136 mmol/L (ref 135–145)
Total Bilirubin: 0.7 mg/dL (ref 0.0–1.2)
Total Protein: 7 g/dL (ref 6.5–8.1)

## 2024-02-25 LAB — TROPONIN I (HIGH SENSITIVITY)
Troponin I (High Sensitivity): 16 ng/L (ref <18)
Troponin I (High Sensitivity): 26 ng/L — ABNORMAL HIGH (ref <18)
Troponin I (High Sensitivity): 37 ng/L — ABNORMAL HIGH (ref <18)
Troponin I (High Sensitivity): 39 ng/L — ABNORMAL HIGH (ref <18)
Troponin I (High Sensitivity): 33 ng/L — ABNORMAL HIGH (ref <18)
Troponin I (High Sensitivity): 38 ng/L — ABNORMAL HIGH (ref <18)

## 2024-02-25 LAB — HIV ANTIBODY (ROUTINE TESTING W REFLEX): HIV Screen 4th Generation wRfx: NONREACTIVE

## 2024-02-25 LAB — CBG MONITORING, ED: Glucose-Capillary: 241 mg/dL — ABNORMAL HIGH (ref 70–99)

## 2024-02-25 LAB — BRAIN NATRIURETIC PEPTIDE: B Natriuretic Peptide: 440.5 pg/mL — ABNORMAL HIGH (ref 0.0–100.0)

## 2024-02-25 MED ORDER — FUROSEMIDE 10 MG/ML IJ SOLN
20.0000 mg | Freq: Once | INTRAMUSCULAR | Status: AC
  Administered 2024-02-25: 20 mg via INTRAVENOUS
  Filled 2024-02-25: qty 2

## 2024-02-25 MED ORDER — INSULIN ASPART 100 UNIT/ML IJ SOLN: 0.0000 [IU] | Freq: Every day | INTRAMUSCULAR | Status: DC

## 2024-02-25 MED ORDER — HYDRALAZINE HCL 20 MG/ML IJ SOLN
5.0000 mg | Freq: Once | INTRAMUSCULAR | Status: AC
  Administered 2024-02-25: 5 mg via INTRAVENOUS
  Filled 2024-02-25: qty 1

## 2024-02-25 MED ORDER — INSULIN ASPART 100 UNIT/ML IJ SOLN
0.0000 [IU] | Freq: Three times a day (TID) | INTRAMUSCULAR | Status: DC
  Administered 2024-02-25: 5 [IU] via SUBCUTANEOUS
  Administered 2024-02-25: 3 [IU] via SUBCUTANEOUS
  Administered 2024-02-26: 5 [IU] via SUBCUTANEOUS
  Administered 2024-02-26: 8 [IU] via SUBCUTANEOUS

## 2024-02-25 MED ORDER — METFORMIN HCL 500 MG PO TABS
1000.0000 mg | ORAL_TABLET | Freq: Two times a day (BID) | ORAL | Status: DC
  Administered 2024-02-25 – 2024-02-26 (×2): 1000 mg via ORAL
  Filled 2024-02-25 (×2): qty 2

## 2024-02-25 MED ORDER — SACUBITRIL-VALSARTAN 24-26 MG PO TABS
1.0000 | ORAL_TABLET | Freq: Two times a day (BID) | ORAL | Status: DC
  Administered 2024-02-25: 1 via ORAL
  Filled 2024-02-25: qty 1

## 2024-02-25 MED ORDER — HYDRALAZINE HCL 20 MG/ML IJ SOLN
10.0000 mg | Freq: Four times a day (QID) | INTRAMUSCULAR | Status: DC | PRN
  Administered 2024-02-25: 10 mg via INTRAVENOUS
  Filled 2024-02-25: qty 1

## 2024-02-25 MED ORDER — ATORVASTATIN CALCIUM 80 MG PO TABS
80.0000 mg | ORAL_TABLET | Freq: Every day | ORAL | Status: DC
  Administered 2024-02-25 – 2024-02-26 (×2): 80 mg via ORAL
  Filled 2024-02-25: qty 2
  Filled 2024-02-25: qty 1

## 2024-02-25 MED ORDER — ENOXAPARIN SODIUM 40 MG/0.4ML IJ SOSY
40.0000 mg | PREFILLED_SYRINGE | INTRAMUSCULAR | Status: DC
  Administered 2024-02-25 – 2024-02-26 (×2): 40 mg via SUBCUTANEOUS
  Filled 2024-02-25 (×2): qty 0.4

## 2024-02-25 MED ORDER — LABETALOL HCL 5 MG/ML IV SOLN
10.0000 mg | Freq: Once | INTRAVENOUS | Status: AC
  Administered 2024-02-25: 10 mg via INTRAVENOUS
  Filled 2024-02-25: qty 4

## 2024-02-25 MED ORDER — SACUBITRIL-VALSARTAN 97-103 MG PO TABS
1.0000 | ORAL_TABLET | Freq: Two times a day (BID) | ORAL | Status: DC
  Administered 2024-02-25 – 2024-02-26 (×2): 1 via ORAL
  Filled 2024-02-25 (×3): qty 1

## 2024-02-25 NOTE — ED Notes (Signed)
 CCMD notified pt is on telemetry

## 2024-02-25 NOTE — Hospital Course (Signed)
 57  CHF  Acute resp distress and hypoxia. Woke up suddenly from her sleep Doing better after CPAP. She had some lower ext edema.  Occasional ronchi HTN urgency or emergency Exacerbation Worsening HTN Resp standpoint she is better on room air  Trop delta is 10.

## 2024-02-25 NOTE — Inpatient Diabetes Management (Signed)
 Inpatient Diabetes Program Recommendations  AACE/ADA: New Consensus Statement on Inpatient Glycemic Control (2015)  Target Ranges:  Prepandial:   less than 140 mg/dL      Peak postprandial:   less than 180 mg/dL (1-2 hours)      Critically ill patients:  140 - 180 mg/dL   Lab Results  Component Value Date   GLUCAP 150 (H) 04/10/2023   HGBA1C 8.2 (A) 04/10/2023    Review of Glycemic Control  Latest Reference Range & Units 02/25/24 04:45  Glucose 70 - 99 mg/dL 130 (H)   Diabetes history: DM 2 Outpatient Diabetes medications:  Metformin /Jardiance - stopped taking Current orders for Inpatient glycemic control:  Metformin  1000 mg bid  Inpatient Diabetes Program Recommendations:    Please consider adding Novolog  (0-9 units) correction tid with meals and HS.  Thanks,  Josefa Ni, RN, BC-ADM Inpatient Diabetes Coordinator Pager [phone]  (8a-5p)

## 2024-02-25 NOTE — ED Provider Notes (Signed)
 [facility] Emergency Department Provider Note MRN:  [id number]  Arrival date & time: 02/25/24     Chief Complaint   Respiratory Distress   History of Present Illness   Ruth Edwards is a 58 y.o. year-old female with a history of hypertension, diabetes presenting to the ED with chief complaint of respiratory distress.  Acute shortness of breath this evening at about 10 PM.  Denies any shortness of breath.  Patient says she is feeling a lot better after CPAP provided by EMS.  Still feels some mild shortness of breath but no pain.  Has not been sick recently with fever or cough.  Has had similar shortness of breath episodes in the past due to her CHF.  Review of Systems  A thorough review of systems was obtained and all systems are negative except as noted in the HPI and PMH.   Patient's Health History    Past Medical History:  Diagnosis Date   DM2 (diabetes mellitus, type 2) (HCC)    HTN (hypertension)     Past Surgical History:  Procedure Laterality Date   CESAREAN SECTION     RIGHT/LEFT HEART CATH AND CORONARY ANGIOGRAPHY N/A 12/12/2022   Procedure: RIGHT/LEFT HEART CATH AND CORONARY ANGIOGRAPHY;  Surgeon: Alwin Baars, DO;  Location: [facility] INVASIVE CV LAB;  Service: Cardiovascular;  Laterality: N/A;    History reviewed. No pertinent family history.  Social History   Socioeconomic History   Marital status: Married    Spouse name: IBrahim   Number of children: Not on file   Years of education: Not on file   Highest education level: Not on file  Occupational History   Not on file  Tobacco Use   Smoking status: Never   Smokeless tobacco: Never  Vaping Use   Vaping status: Never Used  Substance and Sexual Activity   Alcohol use: Never   Drug use: Never   Sexual activity: Yes  Other Topics Concern   Not on file  Social History Narrative   Not on file   Social Drivers of Health   Financial Resource Strain: High Risk (12/10/2022)    Overall Financial Resource Strain (CARDIA)    Difficulty of Paying Living Expenses: Hard  Food Insecurity: No Food Insecurity (12/27/2022)   Hunger Vital Sign    Worried About Running Out of Food in the Last Year: Never true    Ran Out of Food in the Last Year: Never true  Transportation Needs: No Transportation Needs (12/10/2022)   PRAPARE - Administrator, Civil Service (Medical): No    Lack of Transportation (Non-Medical): No  Physical Activity: Not on file  Stress: Not on file  Social Connections: Not on file  Intimate Partner Violence: Not At Risk (12/27/2022)   Humiliation, Afraid, Rape, and Kick questionnaire    Fear of Current or Ex-Partner: No    Emotionally Abused: No    Physically Abused: No    Sexually Abused: No     Physical Exam   Vitals:   02/25/24 0345 02/25/24 0500  BP: 132/82 (!) 174/102  Pulse: 80 85  Resp: 20 (!) 23  Temp:    SpO2: 97% 100%    CONSTITUTIONAL: Well-appearing, NAD NEURO/PSYCH:  Alert and oriented x 3, no focal deficits EYES:  eyes equal and reactive ENT/NECK:  no LAD, no JVD CARDIO: Regular rate, well-perfused, normal S1 and S2 PULM:  CTAB no wheezing or rhonchi GI/GU:  non-distended, non-tender MSK/SPINE:  No gross deformities, no edema  SKIN:  no rash, atraumatic   *Additional and/or pertinent findings included in MDM below  Diagnostic and Interventional Summary    EKG Interpretation Date/Time:  Tuesday February 25 2024 03:09:02 EDT Ventricular Rate:  93 PR Interval:  160 QRS Duration:  103 QT Interval:  337 QTC Calculation: 420 R Axis:   36  Text Interpretation: Sinus rhythm LVH with secondary repolarization abnormality Confirmed by Gwenetta Lennert [id number]) on 02/25/2024 6:01:30 AM       Labs Reviewed  COMPREHENSIVE METABOLIC PANEL WITH GFR - Abnormal; Notable for the following components:      Result Value   Glucose, Bld 328 (*)    Calcium  8.7 (*)    Albumin 2.8 (*)    All other components within normal limits   TROPONIN I (HIGH SENSITIVITY) - Abnormal; Notable for the following components:   Troponin I (High Sensitivity) 26 (*)    All other components within normal limits  CBC  BRAIN NATRIURETIC PEPTIDE  TROPONIN I (HIGH SENSITIVITY)    DG Chest Port 1 View  Final Result      Medications  furosemide  (LASIX ) injection 20 mg (has no administration in time range)  labetalol  (NORMODYNE ) injection 10 mg (has no administration in time range)     Procedures  /  Critical Care .Critical Care  Performed by: Edson Graces, MD Authorized by: Edson Graces, MD   Critical care provider statement:    Critical care time (minutes):  45   Critical care was necessary to treat or prevent imminent or life-threatening deterioration of the following conditions:  Respiratory failure   Critical care was time spent personally by me on the following activities:  Development of treatment plan with patient or surrogate, discussions with consultants, evaluation of patient's response to treatment, examination of patient, ordering and review of laboratory studies, ordering and review of radiographic studies, ordering and performing treatments and interventions, pulse oximetry, re-evaluation of patient's condition and review of old charts   ED Course and Medical Decision Making  Initial Impression and Ddx Differential diagnosis includes CHF exacerbation, PE is also considered but felt to be less likely.  Hypertensive emergency a possibility.  Past medical/surgical history that increases complexity of ED encounter: CHF  Interpretation of Diagnostics I personally reviewed the EKG and my interpretation is as follows: Sinus rhythm, LVH pattern similar to prior  No significant blood count or electrolyte disturbance.  Initial troponin negative, second troponin slightly rising  Patient Reassessment and Ultimate Disposition/Management     Patient's respiratory effort and oxygen requirement has improved, now on room  air resting comfortably.  Still quite hypertensive and with the elevated troponin will request hospitalist admission for hypertensive urgency.  Patient management required discussion with the following services or consulting groups:  Hospitalist Service  Complexity of Problems Addressed Acute illness or injury that poses threat of life of bodily function  Additional Data Reviewed and Analyzed Further history obtained from: Prior labs/imaging results  Additional Factors Impacting ED Encounter Risk Consideration of hospitalization  Merrick Abe. Harless Lien, MD [facility] Emergency Medicine [facility] [email or web address]  Final Clinical Impressions(s) / ED Diagnoses     ICD-10-CM   1. Hypertensive urgency  I16.0       ED Discharge Orders     None        Discharge Instructions Discussed with and Provided to Patient:   Discharge Instructions   None      Edson Graces, MD 02/25/24 [phone]

## 2024-02-25 NOTE — H&P (Signed)
 Date: 02/25/2024               Patient Name:  Ruth Edwards MRN: [id number]  DOB: Mar 28, 1966 Age / Sex: 58 y.o., female   PCP: Chrissie Coupe, MD         Medical Service: Internal Medicine Teaching Service         Attending Physician: Dr. Sandie Cross, MD      First Contact: Dr. Maxie Spaniel, MD [phone]    Second Contact: Dr. Chrissie Coupe, MD [phone]         After Hours (After 5p/  First Contact Pager: [phone]  weekends / holidays): Second Contact Pager: [phone]   SUBJECTIVE   Chief Complaint: Shortness of Breath  History of Present Illness:   Ruth Edwards is a 58 year old female with a past medical history of heart failure with last EF being  40-45% with left ventricular global hypokinesis, HTN, and and uncontrolled Type 2 DM who presents with shortness of breath. She states that last night she woke up and felt like she couldn't breathe. There was no progression of symptoms, and this was an acute episode which prompted her to call EMS. She also states her legs seemed a litlte more swollen than normal this morning. She denies any recent illnesses, orthopnea, chest pain, nausea, or vomiting.  Per daughter, she hasn't been taking any of her medications as they make her feel "weak", and she is currently prescribed Entresto  97/103 BID, bidil  TID, amlodipine  10mg , Coreg  20mg , and spironolactone . She does not check her blood pressure at home, and per chart review, her last clinic visit was on 04/10/2023 for which she had hypertension as well.   ED Course: One dose of IV Lasix  20mg  given, as well as labetalol  10mg   Medications (Prescribed but not taking) Amlodipine  10mg   Coreg  25mg   Jardiance  10mg   Bidil  20-37.5 Metformin  1000mg  BID Entresto  97-103 BID Aldactone  25mg    Past Medical History  Past Surgical History:  Procedure Laterality Date   CESAREAN SECTION     RIGHT/LEFT HEART CATH AND CORONARY ANGIOGRAPHY N/A 12/12/2022   Procedure: RIGHT/LEFT  HEART CATH AND CORONARY ANGIOGRAPHY;  Surgeon: Alwin Baars, DO;  Location: [facility];  Service: Cardiovascular;  Laterality: N/A;    Social:  Lives With: Daughter at home Occupation: Does not work Support: Family in the area Level of Function: Indepndent in all ADLs/IADLs PCP: Dr. Analiza Cowger, MD Substances: No history of tobacco use, alcohol use, recreational drugs  Family History:  Unable to obtain  Allergies: Allergies as of 02/25/2024   (No Known Allergies)    Review of Systems: A complete ROS was negative except as per HPI.   OBJECTIVE:   Physical Exam: Blood pressure (!) 194/116, pulse 88, temperature 98 F (36.7 C), temperature source Oral, resp. rate (!) 22, height 5\' 1"  (1.549 m), SpO2 94%.  Constitutional: well-appearing female sitting in bed, in no acute distress HENT: normocephalic atraumatic, mucous membranes moist Eyes: conjunctiva non-erythematous Neck: supple Cardiovascular: regular rate and rhythm, no m/r/g Pulmonary/Chest: crackles heard in upper lung fields bilaterally Abdominal: soft, non-tender, non-distended MSK: normal bulk and tone Neurological: alert & oriented x 3, 5/5 strength in bilateral upper and lower extremities, normal gait Skin: warm and dry Psych: Normal mood and affect  Labs: CBC    Component Value Date/Time   WBC 6.8 02/25/2024 0308   RBC 4.71 02/25/2024 0308   HGB 12.4 02/25/2024 0308   HCT 37.7 02/25/2024 0308   PLT 230  02/25/2024 0308   MCV 80.0 02/25/2024 0308   MCH 26.3 02/25/2024 0308   MCHC 32.9 02/25/2024 0308   RDW 13.6 02/25/2024 0308   LYMPHSABS 3.6 12/09/2022 0237   MONOABS 0.5 12/09/2022 0237   EOSABS 0.1 12/09/2022 0237   BASOSABS 0.1 12/09/2022 0237     CMP     Component Value Date/Time   NA 136 02/25/2024 0445   NA 144 12/27/2022 1631   K 3.7 02/25/2024 0445   CL 101 02/25/2024 0445   CO2 25 02/25/2024 0445   GLUCOSE 328 (H) 02/25/2024 0445   BUN 10 02/25/2024 0445   BUN 8  12/27/2022 1631   CREATININE 0.57 02/25/2024 0445   CALCIUM  8.7 (L) 02/25/2024 0445   PROT 7.0 02/25/2024 0445   ALBUMIN 2.8 (L) 02/25/2024 0445   AST 25 02/25/2024 0445   ALT 18 02/25/2024 0445   ALKPHOS 72 02/25/2024 0445   BILITOT 0.7 02/25/2024 0445   GFRNONAA >60 02/25/2024 0445    Imaging:  Chest x-ray: Cardiomegaly with mild interstitial edema, bilateral lower opacities favoring alveolar edema  EKG: personally reviewed my interpretation is regular rate and rhythm, similar to previous EKG  ASSESSMENT & PLAN:   Assessment & Plan by Problem: Principal Problem:   Acute exacerbation of CHF (congestive heart failure) (HCC)   Ruth Edwards is a 58 y.o. person living with a history of heart failure with reduced ejection fraction, diabetes, hypertension who presented with shortness of breath and admitted for a heart failure exacerbation on hospital day 0  #Acute Heart Failure Exacerbation #HTN Emergency  Patient initially presented with shortness of breath when she was lying down, in the emergency department she was on CPAP but then quickly transitioned off.  With lower extremity edema, chest x-ray findings, elevated BNP, and crackles heard on auscultation I think this is a heart failure exacerbation.  She is already status post 20 mg of IV Lasix , will give 20 mg today.  Heart failure exacerbation may be in the setting of uncontrolled hypertension, question of flash pulmonary edema.  She is on quite an extensive antihypertensive regimen with 5 different medications, however she has not been taking them.  I think her feeling of "weakness" is from soft blood pressures, she describes orthostasis.  Goal will be to find a good level for her with her GDMT.  Will slowly restart them while she was here, starting with Entresto  24-26 twice a day.  Thankfully she is not requiring oxygen.  Plan: - Another dose of IV Lasix  20 mg - Restart Entresto  24-26 - Daily weights - Strict I's and  O's   #Type 2 Diabetes Mellitus Last A1c checked in June 2024 was 8.2 we will recheck it today.  Her regimen currently is Jardiance  10 mg, metformin  to 1000 mg twice a day.  Will restart them while she is here.  #Hyperlipidemia  Restarting home atorvastatin  80mg   Diet: Heart Healthy VTE: Lovenox  IVF: NA Code: Full  Prior to Admission Living Arrangement: Home, living with family Anticipated Discharge Location: Home Barriers to Discharge: Medical Management  Dispo: Admit patient to Observation with expected length of stay less than 2 midnights.  Signed: Kasiah Manka, MD Internal Medicine Resident PGY-2 [phone] 02/25/2024, 9:16 AM

## 2024-02-25 NOTE — ED Notes (Signed)
 Patient stood up to [facility] with no issues or complaints.

## 2024-02-25 NOTE — ED Triage Notes (Addendum)
 Patient BIB [facility] from home due to respiratory distress. EMS reports increasing SOB x4-5 hours until family called EMS. Patient has hx of CHF and does not take medications as prescribed. Does not normally require o2. EMS reports bilateral rales. EMS gave 3 sublingual nitroglycerin  tablets. Patient A&Ox4 at this time. Patient arrived on CPAP from EMS.

## 2024-02-25 NOTE — Progress Notes (Signed)
 Heart Failure Navigator Progress Note  Assessed for Heart & Vascular TOC clinic readiness.  Patient does not meet criteria due to Advanced Heart Failure Team patient of Dr. Gasper Lloyd.   Navigator will sign off at this time.   Rhae Hammock, BSN, Scientist, clinical (histocompatibility and immunogenetics) Only

## 2024-02-25 NOTE — Progress Notes (Signed)
   02/25/24 1952  Vitals  Temp 98.9 F (37.2 C)  Temp Source Oral  BP (!) 164/108  MAP (mmHg) 124  BP Location Left Arm  BP Method Automatic  Patient Position (if appropriate) Sitting  Pulse Rate (!) 110  Pulse Rate Source Monitor  Resp 18  MEWS COLOR  MEWS Score Color Green  Oxygen Therapy  SpO2 97 %  O2 Device Room Air  MEWS Score  MEWS Temp 0  MEWS Systolic 0  MEWS Pulse 1  MEWS RR 0  MEWS LOC 0  MEWS Score 1    Pt is A&O x 4. Bp elevated-see above. Pt asymptomatic. Scheduled Entresto  given as ordered. Ambulating independently in the room. Educated on falls and safety precautions, call bell and plan of care, pt verbalizes understanding.  Call bell in reach. Will continue to monitor.

## 2024-02-26 ENCOUNTER — Other Ambulatory Visit (HOSPITAL_COMMUNITY): Payer: Self-pay

## 2024-02-26 ENCOUNTER — Observation Stay (HOSPITAL_COMMUNITY)

## 2024-02-26 DIAGNOSIS — I5020 Unspecified systolic (congestive) heart failure: Secondary | ICD-10-CM | POA: Diagnosis not present

## 2024-02-26 DIAGNOSIS — I160 Hypertensive urgency: Secondary | ICD-10-CM | POA: Diagnosis not present

## 2024-02-26 DIAGNOSIS — I110 Hypertensive heart disease with heart failure: Secondary | ICD-10-CM | POA: Diagnosis not present

## 2024-02-26 DIAGNOSIS — E119 Type 2 diabetes mellitus without complications: Secondary | ICD-10-CM | POA: Diagnosis not present

## 2024-02-26 LAB — ECHOCARDIOGRAM COMPLETE
AR max vel: 2.3 cm2
AV Peak grad: 6.8 mmHg
Ao pk vel: 1.3 m/s
Area-P 1/2: 7.59 cm2
Height: 65 in
MV VTI: 3.35 cm2
S' Lateral: 4.6 cm
Weight: 3107.6 [oz_av]

## 2024-02-26 LAB — CBC
HCT: 39.2 % (ref 36.0–46.0)
Hemoglobin: 13.2 g/dL (ref 12.0–15.0)
MCH: 26.2 pg (ref 26.0–34.0)
MCHC: 33.7 g/dL (ref 30.0–36.0)
MCV: 77.9 fL — ABNORMAL LOW (ref 80.0–100.0)
Platelets: 243 10*3/uL (ref 150–400)
RBC: 5.03 MIL/uL (ref 3.87–5.11)
RDW: 13.5 % (ref 11.5–15.5)
WBC: 9.6 10*3/uL (ref 4.0–10.5)
nRBC: 0 % (ref 0.0–0.2)

## 2024-02-26 LAB — GLUCOSE, CAPILLARY
Glucose-Capillary: 240 mg/dL — ABNORMAL HIGH (ref 70–99)
Glucose-Capillary: 264 mg/dL — ABNORMAL HIGH (ref 70–99)

## 2024-02-26 LAB — BASIC METABOLIC PANEL WITH GFR
Anion gap: 10 (ref 5–15)
BUN: 16 mg/dL (ref 6–20)
CO2: 25 mmol/L (ref 22–32)
Calcium: 8.6 mg/dL — ABNORMAL LOW (ref 8.9–10.3)
Chloride: 101 mmol/L (ref 98–111)
Creatinine, Ser: 0.78 mg/dL (ref 0.44–1.00)
GFR, Estimated: >60 mL/min (ref >60)
Glucose, Bld: 209 mg/dL — ABNORMAL HIGH (ref 70–99)
Potassium: 3 mmol/L — ABNORMAL LOW (ref 3.5–5.1)
Sodium: 136 mmol/L (ref 135–145)

## 2024-02-26 MED ORDER — AMLODIPINE BESYLATE 10 MG PO TABS
10.0000 mg | ORAL_TABLET | Freq: Every day | ORAL | Status: DC
Start: 2024-02-26 — End: 2024-02-26
  Administered 2024-02-26: 10 mg via ORAL
  Filled 2024-02-26: qty 1

## 2024-02-26 MED ORDER — POTASSIUM CHLORIDE 20 MEQ PO PACK
60.0000 meq | PACK | Freq: Two times a day (BID) | ORAL | Status: AC
  Administered 2024-02-26: 60 meq via ORAL
  Filled 2024-02-26: qty 3

## 2024-02-26 NOTE — Plan of Care (Signed)

## 2024-02-26 NOTE — Care Management Obs Status (Signed)
 MEDICARE OBSERVATION STATUS NOTIFICATION   Patient Details  Name: Ruth Edwards MRN: [id number] Date of Birth: November 12, 1965   Medicare Observation Status Notification Given:  Yes   Moon/Obs letter signed and copy given  Wynonia Hedges 02/26/2024, 10:49 AM

## 2024-02-26 NOTE — Discharge Summary (Signed)
 Name: Ruth Edwards MRN: [id number] DOB: 24-Feb-1966 58 y.o. PCP: Nooruddin, Saad, MD  Date of Admission: 02/25/2024  3:05 AM Date of Discharge:  02/26/24 Attending Physician: Dr.  Alwin Baars  DISCHARGE DIAGNOSIS:  Primary Problem: Acute exacerbation of CHF (congestive heart failure) [facility])   Hospital Problems: Principal Problem:   Acute exacerbation of CHF (congestive heart failure) (HCC) Active Problems:   HFrEF (heart failure with reduced ejection fraction) (HCC)   Diabetes mellitus (HCC)   Hypertensive urgency    DISCHARGE MEDICATIONS:   Allergies as of 02/26/2024   No Known Allergies      Medication List     PAUSE taking these medications    isosorbide -hydrALAZINE  20-37.5 MG tablet Wait to take this until your doctor or other care provider tells you to start again. Commonly known as: BiDil  Take 2 tablets by mouth 3 (three) times daily.   Jardiance  10 MG Tabs tablet Wait to take this until your doctor or other care provider tells you to start again. Generic drug: empagliflozin  Take 1 tablet (10 mg total) by mouth daily.   spironolactone  25 MG tablet Wait to take this until your doctor or other care provider tells you to start again. Commonly known as: ALDACTONE  Take 1 tablet (25 mg total) by mouth daily.       TAKE these medications    Accu-Chek Guide test strip Generic drug: glucose blood Use as directed (Use as directed)   Accu-Chek Guide w/Device Kit USE AS DIRECTED (USE AS DIRECTED)   Accu-Chek Softclix Lancets lancets Use as directed up to 4 times daily   amLODipine  10 MG tablet Commonly known as: NORVASC  Take 1 tablet (10 mg total) by mouth daily.   atorvastatin  80 MG tablet Commonly known as: LIPITOR  Take 1 tablet (80 mg total) by mouth daily.   blood glucose meter kit and supplies Dispense based on patient and insurance preference. Use up to four times daily as directed. (FOR ICD-10 E10.9, E11.9).   carvedilol  25 MG  tablet Commonly known as: COREG  Take 1 tablet (25 mg total) by mouth 2 (two) times daily.   Entresto  97-103 MG Generic drug: sacubitril -valsartan  Take 1 tablet by mouth 2 (two) times daily. What changed: when to take this   metFORMIN  500 MG tablet Commonly known as: GLUCOPHAGE  Take 2 tablets (1,000 mg total) by mouth 2 (two) times daily with a meal.        DISPOSITION AND FOLLOW-UP:  [name] was discharged from [facility] in Stable condition. At the hospital follow up visit please address:  Follow-up Recommendations: Consults: None Labs: Basic Metabolic Profile Studies: Echo (resulting pending at discharge)  Medications: Amlodipine , Coreg , BiDil , Entresto , Aldactone , metformin , Jardiance    HOSPITAL COURSE:  Patient Summary: #Acute heart failure exacerbation #Hypertensive emergency Initially presented with shortness of breath while laying down.  In the ED, she was on CPAP but transitioned to room air.  Chest x-ray notable for cardiomegaly with mild interstitial anemia and bilateral lower opacities.  BNP elevated at 440.5.  Troponin peaked at roughly 37.  She was given 2 dosages of Lasix .  Prior to presentation, she had an extensive blood pressure regiment of amlodipine  10 mg daily, Coreg  25 mg, BiDil  20-37.5, Entresto  97-103 twice daily, and Aldactone  25 mg.  Slowly restarted her medications begin with her Entresto .  She was started on amlodipine  after her blood pressure remained elevated at roughly 160.  Outpatient, will recommend slowly resuming her medication as she endorsed dizziness and overall unease likely  from overmedication of her hypertension. Prior to discharge, she was medically stable, but the results of her echo were pending. Please follow-up with her results at her hospital follow-up.  - Continue Entresto  and amlodipine  - Follow-up echo at hospital follow-up   #Type 2 diabetes mellitus Last A1c in June 2024 was 8.1.  Today, elevated  at 11.3.  Current home regimen includes Jardiance  10 mg/day and metformin  1000 mg twice daily.  Continued on her metformin  but held her Jardiance .  Recommend follow-up outpatient for improved glycemic control.  #Hyperlipidemia Restarted home atorvastatin  80 mg/day.  #Hypokalemia Potassium prior to discharge was 3.0 likely from her Lasix .  This was replenished and recommend follow-up outpatient.   DISCHARGE INSTRUCTIONS:   Discharge Instructions     (HEART FAILURE PATIENTS) Call MD:  Anytime you have any of the following symptoms: 1) 3 pound weight gain in 24 hours or 5 pounds in 1 week 2) shortness of breath, with or without a dry hacking cough 3) swelling in the hands, feet or stomach 4) if you have to sleep on extra pillows at night in order to breathe.   Complete by: As directed    Call MD for:  difficulty breathing, headache or visual disturbances   Complete by: As directed    Call MD for:  extreme fatigue   Complete by: As directed    Call MD for:  hives   Complete by: As directed    Call MD for:  persistant dizziness or light-headedness   Complete by: As directed    Call MD for:  persistant nausea and vomiting   Complete by: As directed    Call MD for:  redness, tenderness, or signs of infection (pain, swelling, redness, odor or green/yellow discharge around incision site)   Complete by: As directed    Call MD for:  severe uncontrolled pain   Complete by: As directed    Call MD for:  temperature >100.4   Complete by: As directed    Diet - low sodium heart healthy   Complete by: As directed    Discharge instructions   Complete by: As directed    [name],   You were hospitalized for heart failure likely caused by your hypertension.  You were given fluid pills to help with this and to improve your breathing.  At this time, we feel that you are safe to go home.  Please *CONTINUE* taking the following medications: - Coreg  25 mg twice daily - Entresto  97-103 mg twice  daily - Amlodipine  10 mg/day  Please *STOP* the following medications: - BiDil  20-37.5 mg two tablets 3 times daily - Jardiance  10 mg/day - Spironolactone  25 mg/day  For the medications above, you will stop taking these until you follow-up with your primary care provider.  These will be restarted depending upon how your blood pressure is.  Before following up with your primary care provider, please record your blood pressures daily in a log and bring this to your appointment.  Please continue taking your other medications as prescribed.  You will follow-up with the internal medicine clinic in the next couple of weeks.  If you have any questions, please call us  at [phone].  We are glad that you are feeling better!   Increase activity slowly   Complete by: As directed        SUBJECTIVE:  Patient was evaluated bedside.  Denied any chest pain, troubles breathing, or other signs or symptoms.  She is able to sleep well  without any orthopnea last night.  She also says she feels much improved from her initial presentation yesterday. Discharge Vitals:   BP (!) 160/100 (BP Location: Left Arm)   Pulse 98   Temp 98.8 F (37.1 C) (Oral)   Resp 18   Ht 5\' 5"  (1.651 m) Comment: pt stated  Wt 88.1 kg   SpO2 100%   BMI 32.32 kg/m   OBJECTIVE:  Physical Exam Constitutional:      Appearance: Normal appearance.  HENT:     Head: Normocephalic and atraumatic.  Cardiovascular:     Rate and Rhythm: Normal rate and regular rhythm.  Pulmonary:     Effort: Pulmonary effort is normal.     Breath sounds: Normal breath sounds.  Abdominal:     General: Abdomen is flat. Bowel sounds are normal.     Palpations: Abdomen is soft.  Neurological:     General: No focal deficit present.     Mental Status: She is alert and oriented to person, place, and time.  Psychiatric:        Mood and Affect: Mood normal.        Behavior: Behavior normal.     Pertinent Labs, Studies, and Procedures:      Latest Ref Rng & Units 02/26/2024    6:32 AM 02/25/2024    3:08 AM 12/13/2022    1:25 AM  CBC  WBC 4.0 - 10.5 K/uL 9.6  6.8  8.7   Hemoglobin 12.0 - 15.0 g/dL 16.1  09.6  04.5   Hematocrit 36.0 - 46.0 % 39.2  37.7  41.7   Platelets 150 - 400 K/uL 243  230  265        Latest Ref Rng & Units 02/26/2024    6:32 AM 02/25/2024    4:45 AM 03/22/2023    3:18 PM  CMP  Glucose 70 - 99 mg/dL 409  811  914   BUN 6 - 20 mg/dL 16  10  15    Creatinine 0.44 - 1.00 mg/dL 7.82  9.56  2.13   Sodium 135 - 145 mmol/L 136  136  142   Potassium 3.5 - 5.1 mmol/L 3.0  3.7  3.7   Chloride 98 - 111 mmol/L 101  101  105   CO2 22 - 32 mmol/L 25  25  23    Calcium  8.9 - 10.3 mg/dL 8.6  8.7  9.8   Total Protein 6.5 - 8.1 g/dL  7.0    Total Bilirubin 0.0 - 1.2 mg/dL  0.7    Alkaline Phos 38 - 126 U/L  72    AST 15 - 41 U/L  25    ALT 0 - 44 U/L  18     DG Chest Port 1 View Result Date: 02/25/2024 EXAM: 1 VIEW(S) XRAY OF THE CHEST 02/25/2024 03:21:00 AM COMPARISON: CT chest 12/09/2022. CLINICAL HISTORY: SOB. Respiratory distress, shortness of breath. FINDINGS: LUNGS AND PLEURA: Mild interstitial edema. Superimposed patchy bilateral lower opacities favor alveolar edema, although infection/pneumonia is possible. Small left pleural effusion. HEART AND MEDIASTINUM: Cardiomegaly. BONES AND SOFT TISSUES: No acute osseous abnormality. IMPRESSION: 1. Cardiomegaly with mild interstitial edema and a small left pleural effusion. 2. Superimposed patchy bilateral lower opacities, favoring alveolar edema, although infection/pneumonia is possible. Electronically signed by: Zadie Herter MD 02/25/2024 03:25 AM EDT RP Workstation: [id number]    Signed: Maxie Spaniel, MD Internal Medicine Resident, PGY-1 [facility]  Pager: [phone]

## 2024-02-26 NOTE — Inpatient Diabetes Management (Addendum)
 Inpatient Diabetes Program Recommendations  AACE/ADA: New Consensus Statement on Inpatient Glycemic Control (2015)  Target Ranges:  Prepandial:   less than 140 mg/dL      Peak postprandial:   less than 180 mg/dL (1-2 hours)      Critically ill patients:  140 - 180 mg/dL   Lab Results  Component Value Date   GLUCAP 240 (H) 02/26/2024   HGBA1C 11.3 (H) 02/25/2024    Review of Glycemic Control  Latest Reference Range & Units 02/25/24 11:46 02/25/24 15:20 02/25/24 20:01 02/26/24 08:18  Glucose-Capillary 70 - 99 mg/dL 161 (H) 096 (H) 045 (H) 240 (H)   Diabetes history: DM 2 Outpatient Diabetes medications:  Metformin  1000 mg bid/Jardiance  10 mg Daily- stopped taking Current orders for Inpatient glycemic control:  Metformin  1000 mg bid Novolog  0-15 units tid + hs  Inpatient Diabetes Program Recommendations:    -   Restart Jardiance  10 mg      Based on A1c level of 11.3% may need insulin  at d/c. Will discuss with pt. Compliance maybe an issue if doing injections, may need Amaryl 1 mg Daily.  Spoke with pt at bedside regarding A1c level of 11.3%. Pt reports being without her medications for 3 months. She also reports checking her glucose occassionally with a FSBG. I expressed my concerns about glucose control on only oral medications at home. I emphasized lifestyle modifications. I also discussed the potential possibility of needing insulin  at home. Pt would like for me to look into insurance coverage of CGM. Pt would like to discuss the need for insulin  with attending prior to my education. Will follow for educational needs and will update on status of cost of insulin  and CGM for d/c. Pharmacy running benefits check.  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 If she does not which looks like in [employer] it showing she does not, she needs to call her [place] Medicaid Social Worker to get that removed    Thanks,  Eloise Hake RN, MSN, BC-ADM Inpatient Diabetes Coordinator Team Pager [phone] (8a-5p)

## 2024-02-27 ENCOUNTER — Other Ambulatory Visit: Payer: Self-pay

## 2024-02-27 ENCOUNTER — Other Ambulatory Visit (HOSPITAL_COMMUNITY): Payer: Self-pay | Admitting: Student

## 2024-02-28 ENCOUNTER — Other Ambulatory Visit: Payer: Self-pay

## 2024-02-28 ENCOUNTER — Other Ambulatory Visit (HOSPITAL_COMMUNITY): Payer: Self-pay

## 2024-03-04 ENCOUNTER — Other Ambulatory Visit: Payer: Self-pay

## 2024-03-04 ENCOUNTER — Other Ambulatory Visit (HOSPITAL_COMMUNITY): Payer: Self-pay

## 2024-03-04 ENCOUNTER — Encounter: Payer: Self-pay | Admitting: Student

## 2024-03-04 ENCOUNTER — Ambulatory Visit: Admitting: Student

## 2024-03-04 VITALS — BP 184/108 | HR 79 | Temp 98.2°F | Ht 61.0 in | Wt 199.6 lb

## 2024-03-04 DIAGNOSIS — I5020 Unspecified systolic (congestive) heart failure: Secondary | ICD-10-CM | POA: Diagnosis not present

## 2024-03-04 DIAGNOSIS — I10 Essential (primary) hypertension: Secondary | ICD-10-CM

## 2024-03-04 DIAGNOSIS — E1165 Type 2 diabetes mellitus with hyperglycemia: Secondary | ICD-10-CM | POA: Diagnosis not present

## 2024-03-04 DIAGNOSIS — E66812 Obesity, class 2: Secondary | ICD-10-CM

## 2024-03-04 DIAGNOSIS — Z7984 Long term (current) use of oral hypoglycemic drugs: Secondary | ICD-10-CM

## 2024-03-04 DIAGNOSIS — I110 Hypertensive heart disease with heart failure: Secondary | ICD-10-CM | POA: Diagnosis present

## 2024-03-04 MED ORDER — FUROSEMIDE 20 MG PO TABS
20.0000 mg | ORAL_TABLET | Freq: Every day | ORAL | 11 refills | Status: DC
  Filled 2024-03-04: qty 30, 30d supply, fill #0
  Filled 2024-04-28: qty 30, 30d supply, fill #1

## 2024-03-04 MED ORDER — METFORMIN HCL 500 MG PO TABS
1000.0000 mg | ORAL_TABLET | Freq: Two times a day (BID) | ORAL | 0 refills | Status: DC
  Filled 2024-03-04: qty 120, 30d supply, fill #0

## 2024-03-04 MED ORDER — AMLODIPINE BESYLATE 10 MG PO TABS
10.0000 mg | ORAL_TABLET | Freq: Every day | ORAL | 11 refills | Status: DC
  Filled 2024-03-04: qty 30, 30d supply, fill #0
  Filled 2024-04-28: qty 30, 30d supply, fill #1

## 2024-03-04 MED ORDER — EMPAGLIFLOZIN 10 MG PO TABS
10.0000 mg | ORAL_TABLET | Freq: Every day | ORAL | 11 refills | Status: AC
  Filled 2024-03-04: qty 30, 30d supply, fill #0
  Filled 2024-04-28: qty 30, 30d supply, fill #1
  Filled 2024-08-19: qty 30, 30d supply, fill #2
  Filled 2024-11-03: qty 30, 30d supply, fill #3

## 2024-03-04 MED ORDER — SPIRONOLACTONE 25 MG PO TABS
25.0000 mg | ORAL_TABLET | Freq: Every day | ORAL | 11 refills | Status: DC
Start: 2024-03-04 — End: 2024-07-08
  Filled 2024-03-04: qty 30, 30d supply, fill #0
  Filled 2024-04-28: qty 30, 30d supply, fill #1

## 2024-03-04 MED ORDER — SACUBITRIL-VALSARTAN 97-103 MG PO TABS
1.0000 | ORAL_TABLET | Freq: Two times a day (BID) | ORAL | 11 refills | Status: DC
  Filled 2024-03-04: qty 60, 30d supply, fill #0
  Filled 2024-04-28: qty 60, 30d supply, fill #1

## 2024-03-04 NOTE — Progress Notes (Signed)
 Patient name: Ruth Edwards Date of birth: [birth year] Date of visit: 03/04/24  Subjective  Chief Complaint  Patient presents with   Hypertension   Diabetes   Hospitalization Follow-up    HPI Ruth Edwards is here for follow-up after hospitalization for acute systolic heart failure.  Since hospital discharge she has been doing better.  She denies dyspnea on exertion.  She is able to lay flat comfortably, sleeping on 1 pillow.  She is able to move around the house and cook and clean as she did before hospitalization.  Her meal consists of lots of the [place] African staple fufu, a starchy, dough-like staple often made from starchy vegetables.  She is not taking amlodipine , BiDil , or metformin  as prescribed. She reports taking atorvastatin , Coreg , Jardiance , Entresto , and spironolactone  as prescribed.  ROS Per above.  Patient Active Problem List   Diagnosis Date Noted   Acute exacerbation of CHF (congestive heart failure) (HCC) 02/25/2024   HFrEF (heart failure with reduced ejection fraction) (HCC) 02/25/2024   Diabetes mellitus (HCC) 02/25/2024   Hypertensive urgency 02/25/2024   Encounter to establish care 12/28/2022   Hyperlipidemia 12/28/2022   Constipation 12/28/2022   Class 2 obesity 12/10/2022   Chronic systolic heart failure (HCC) 12/10/2022   Uncontrolled type 2 diabetes mellitus with hyperglycemia, without long-term current use of insulin  (HCC) 12/09/2022   Essential hypertension 12/09/2022   Past Medical History:  Diagnosis Date   DM2 (diabetes mellitus, type 2) (HCC)    HTN (hypertension)    Outpatient Encounter Medications as of 03/04/2024  Medication Sig   atorvastatin  (LIPITOR ) 80 MG tablet Take 1 tablet (80 mg total) by mouth daily.   carvedilol  (COREG ) 25 MG tablet Take 1 tablet (25 mg total) by mouth 2 (two) times daily.   furosemide  (LASIX ) 20 MG tablet Take 1 tablet (20 mg total) by mouth daily.   Accu-Chek Softclix Lancets lancets Use as  directed up to 4 times daily   amLODipine  (NORVASC ) 10 MG tablet Take 1 tablet (10 mg total) by mouth daily. (Patient not taking: Reported on 03/04/2024)   blood glucose meter kit and supplies Dispense based on patient and insurance preference. Use up to four times daily as directed. (FOR ICD-10 E10.9, E11.9).   Blood Glucose Monitoring Suppl (ACCU-CHEK GUIDE) w/Device KIT USE AS DIRECTED   empagliflozin  (JARDIANCE ) 10 MG TABS tablet Take 1 tablet (10 mg total) by mouth daily.   glucose blood test strip Use as directed   isosorbide -hydrALAZINE  (BIDIL ) 20-37.5 MG tablet Take 2 tablets by mouth 3 (three) times daily. (Patient not taking: Reported on 03/04/2024)   metFORMIN  (GLUCOPHAGE ) 500 MG tablet Take 2 tablets (1,000 mg total) by mouth 2 (two) times daily with a meal. (Patient not taking: Reported on 03/04/2024)   sacubitril -valsartan  (ENTRESTO ) 97-103 MG Take 1 tablet by mouth 2 (two) times daily.   spironolactone  (ALDACTONE ) 25 MG tablet Take 1 tablet (25 mg total) by mouth daily.   [DISCONTINUED] amLODipine  (NORVASC ) 10 MG tablet Take 1 tablet (10 mg total) by mouth daily. (Patient not taking: Reported on 02/25/2024)   [DISCONTINUED] empagliflozin  (JARDIANCE ) 10 MG TABS tablet Take 1 tablet (10 mg total) by mouth daily. (Patient not taking: Reported on 02/25/2024)   [DISCONTINUED] metFORMIN  (GLUCOPHAGE ) 500 MG tablet Take 2 tablets (1,000 mg total) by mouth 2 (two) times daily with a meal. (Patient not taking: Reported on 02/25/2024)   [DISCONTINUED] sacubitril -valsartan  (ENTRESTO ) 97-103 MG Take 1 tablet by mouth 2 (two) times daily. (Patient taking differently: Take 1 tablet  by mouth daily.)   [DISCONTINUED] spironolactone  (ALDACTONE ) 25 MG tablet Take 1 tablet (25 mg total) by mouth daily. (Patient not taking: Reported on 02/25/2024)   No facility-administered encounter medications on file as of 03/04/2024.   Past Surgical History:  Procedure Laterality Date   CESAREAN SECTION     RIGHT/LEFT HEART  CATH AND CORONARY ANGIOGRAPHY N/A 12/12/2022   Procedure: RIGHT/LEFT HEART CATH AND CORONARY ANGIOGRAPHY;  Surgeon: Alwin Baars, DO;  Location: [facility];  Service: Cardiovascular;  Laterality: N/A;   No family history on file. Social History   Socioeconomic History   Marital status: Married    Spouse name: IBrahim   Number of children: Not on file   Years of education: Not on file   Highest education level: Not on file  Occupational History   Not on file  Tobacco Use   Smoking status: Never   Smokeless tobacco: Never  Vaping Use   Vaping status: Never Used  Substance and Sexual Activity   Alcohol use: Never   Drug use: Never   Sexual activity: Yes  Other Topics Concern   Not on file  Social History Narrative   Not on file   Social Drivers of Health   Financial Resource Strain: High Risk (12/10/2022)   Overall Financial Resource Strain (CARDIA)    Difficulty of Paying Living Expenses: Hard  Food Insecurity: Patient Declined (02/25/2024)   Hunger Vital Sign    Worried About Running Out of Food in the Last Year: Patient declined    Ran Out of Food in the Last Year: Patient declined  Transportation Needs: No Transportation Needs (02/25/2024)   PRAPARE - Administrator, Civil Service (Medical): No    Lack of Transportation (Non-Medical): No  Physical Activity: Not on file  Stress: Not on file  Social Connections: Unknown (02/25/2024)   Social Connection and Isolation Panel [NHANES]    Frequency of Communication with Friends and Family: Once a week    Frequency of Social Gatherings with Friends and Family: Patient declined    Attends Religious Services: Patient declined    Database administrator or Organizations: Not on file    Attends Banker Meetings: Patient declined    Marital Status: Patient declined  Intimate Partner Violence: Unknown (02/25/2024)   Humiliation, Afraid, Rape, and Kick questionnaire    Fear of Current or  Ex-Partner: Not on file    Emotionally Abused: No    Physically Abused: No    Sexually Abused: No     Objective  Today's Vitals   03/04/24 1348 03/04/24 1408  BP: (!) 200/128 (!) 184/108  Pulse: 90 79  Temp: 98.2 F (36.8 C)   TempSrc: Oral   SpO2: 100%   Weight: 199 lb 9.6 oz (90.5 kg)   Height: 5\' 1"  (1.549 m)   PainSc: 0-No pain   Body mass index is 37.71 kg/m.   Physical Exam Well-appearing Heart rate and rhythm are normal, strong radial pulse, no murmurs, elevated jugular venous pulsations, some pitting edema around the ankles Breathing comfortably, no basilar crackles Skin is warm and dry Alert and oriented, speech is normal, no facial asymmetry   Assessment & Plan  Problem List Items Addressed This Visit     Uncontrolled type 2 diabetes mellitus with hyperglycemia, without long-term current use of insulin  (HCC)   A1c >11.  We identified some areas for diet control, including cutting back on the fufu.  She will restart her metformin .  Will check her weight in 2 weeks and see her back in a month.  At that time if she has not lost 5 pounds or more she should start a GLP-1 agonist and may need to have conversation about starting insulin  then.  She was overwhelmed by the conversation about heart failure blood pressure and I deferred making other changes to her diabetes regimen.  She will need close follow-up for a while until these problems are under better control.      Relevant Medications   amLODipine  (NORVASC ) 10 MG tablet   empagliflozin  (JARDIANCE ) 10 MG TABS tablet   metFORMIN  (GLUCOPHAGE ) 500 MG tablet   Class 2 obesity   Body mass index is 37.71 kg/m.  I recommend eliminating sugary beverages like soda, sweet tea, and juice entirely. I recommended more vegetables, lean protein, and legumes. Frozen vegetables are healthy and inexpensive. Beans are a healthy and inexpensive source of lean protein and fiber. I recommend gradually increasing exercise. A daily walk is  a great way to start an exercise program.  Referral: no  Pharmacological intervention: no       HFrEF (heart failure with reduced ejection fraction) (HCC) - Primary   Due to hypertensive cardiomyopathy.  Symptoms are under better control but she has signs of volume overload.  She is on SGLT2, Entresto , MRA, and beta-blocker.  Start low-dose of Lasix  to manage volume overload after checking BMP today.      Relevant Medications   amLODipine  (NORVASC ) 10 MG tablet   empagliflozin  (JARDIANCE ) 10 MG TABS tablet   sacubitril -valsartan  (ENTRESTO ) 97-103 MG   spironolactone  (ALDACTONE ) 25 MG tablet   furosemide  (LASIX ) 20 MG tablet   Other Relevant Orders   BMP8+Anion Gap   Essential hypertension (Chronic)   58 year old with systolic heart failure due to hypertensive cardiomyopathy.  She is markedly hypertensive in the clinic today.  She was taking Coreg , Jardiance , Entresto , and spironolactone  after discharge.  Will restart amlodipine  and BiDil .  Return to clinic for blood pressure check in 2 weeks.  If her blood pressure is greater than 130 systolic despite these treatments she should have workup for secondary causes of hypertension. - Amlodipine  10 mg daily - Coreg  25 mg twice daily - BiDil  20-37.5 mg, 2 tablets 3 times daily - Entresto  97-103 mg twice daily - Spironolactone  25 mg daily      Relevant Medications   amLODipine  (NORVASC ) 10 MG tablet   sacubitril -valsartan  (ENTRESTO ) 97-103 MG   spironolactone  (ALDACTONE ) 25 MG tablet   furosemide  (LASIX ) 20 MG tablet   No follow-ups on file.  Adria Hopkins MD 03/04/2024, 5:13 PM

## 2024-03-04 NOTE — Patient Instructions (Signed)
 Take your medicines as prescribed.  Return to clinic in two weeks for a blood pressure and weight check.  Remember to bring all of the medications that you take (including over the counter medications and supplements) with you to every clinic visit.  This after visit summary is an important review of tests, referrals, and medication changes that were discussed during your visit. If you have questions or concerns, call [phone]. Outside of clinic business hours, call the main hospital at [phone] and ask the operator for the on-call internal medicine resident.   Adria Hopkins MD 03/04/2024, 2:51 PM

## 2024-03-04 NOTE — Assessment & Plan Note (Signed)
 A1c >11.  We identified some areas for diet control, including cutting back on the fufu.  She will restart her metformin .  Will check her weight in 2 weeks and see her back in a month.  At that time if she has not lost 5 pounds or more she should start a GLP-1 agonist and may need to have conversation about starting insulin  then.  She was overwhelmed by the conversation about heart failure blood pressure and I deferred making other changes to her diabetes regimen.  She will need close follow-up for a while until these problems are under better control.

## 2024-03-04 NOTE — Assessment & Plan Note (Signed)
 Body mass index is 37.71 kg/m.  I recommend eliminating sugary beverages like soda, sweet tea, and juice entirely. I recommended more vegetables, lean protein, and legumes. Frozen vegetables are healthy and inexpensive. Beans are a healthy and inexpensive source of lean protein and fiber. I recommend gradually increasing exercise. A daily walk is a great way to start an exercise program.  Referral: no  Pharmacological intervention: no

## 2024-03-04 NOTE — Assessment & Plan Note (Signed)
 Due to hypertensive cardiomyopathy.  Symptoms are under better control but she has signs of volume overload.  She is on SGLT2, Entresto , MRA, and beta-blocker.  Start low-dose of Lasix  to manage volume overload after checking BMP today.

## 2024-03-04 NOTE — Assessment & Plan Note (Signed)
 58 year old with systolic heart failure due to hypertensive cardiomyopathy.  She is markedly hypertensive in the clinic today.  She was taking Coreg , Jardiance , Entresto , and spironolactone  after discharge.  Will restart amlodipine  and BiDil .  Return to clinic for blood pressure check in 2 weeks.  If her blood pressure is greater than 130 systolic despite these treatments she should have workup for secondary causes of hypertension. - Amlodipine  10 mg daily - Coreg  25 mg twice daily - BiDil  20-37.5 mg, 2 tablets 3 times daily - Entresto  97-103 mg twice daily - Spironolactone  25 mg daily

## 2024-03-05 ENCOUNTER — Encounter: Payer: Self-pay | Admitting: Student

## 2024-03-05 ENCOUNTER — Other Ambulatory Visit: Payer: Self-pay

## 2024-03-05 LAB — BMP8+ANION GAP
Anion Gap: 13 mmol/L (ref 10.0–18.0)
BUN/Creatinine Ratio: 21 (ref 9–23)
BUN: 13 mg/dL (ref 6–24)
CO2: 24 mmol/L (ref 20–29)
Calcium: 9.5 mg/dL (ref 8.7–10.2)
Chloride: 103 mmol/L (ref 96–106)
Creatinine, Ser: 0.63 mg/dL (ref 0.57–1.00)
Glucose: 210 mg/dL — ABNORMAL HIGH (ref 70–99)
Potassium: 4.1 mmol/L (ref 3.5–5.2)
Sodium: 140 mmol/L (ref 134–144)
eGFR: 102 mL/min/{1.73_m2} (ref >59)

## 2024-03-12 NOTE — Progress Notes (Signed)
 Patient was admitted overnight 4/29 - 02/26/2024 for hypertensive urgency and acute exacerbation of HFrEF.  At the time of her arrival she was prescribed a multidrug regimen for HTN, though notably she did not tolerate reinitiation of each of her medicines by the time of discharge, endorsing dizziness and overall unease.  At the time of discharge, echo results were pending and she was advised to continue her Entresto  and amlodipine , but to place on hold her Jardiance  and BiDil  until follow-up.  2D echocardiogram reveals decrease in LVEF to 25-30%, from 40-45% 1 year ago.  Severely decreased LV function and global hypokinesis was noted, as well as mild concentric LV hypertrophy. Given her BP today and endorsement of having taking some of her medications (keeping in mind some were placed on hold at discharge), agree with initating lasix  and resuming her Jardiance  and Bidil .  She will need close monitoring of her GDMT and should have f/u with a cardiologist given the decline in her EF over the past year.

## 2024-03-13 ENCOUNTER — Telehealth: Payer: Self-pay | Admitting: Student

## 2024-03-13 NOTE — Telephone Encounter (Signed)
 Copied from CRM [id number]. Topic: General - Call Back - No Documentation >> Mar 13, 2024 10:07 AM Ruth Edwards F wrote: Reason for CRM:   Patient's daughter, Ruth Edwards, called in after missing a call from the office. There was no documentation of the call and the patient's daughter would like a call back if necessary.  Callback Number: [phone] ( Ruth Edwards's Cell Phone Number)   Message forward to Dr. Adria Hopkins.  He did send the patient a message via my chart.

## 2024-03-17 ENCOUNTER — Telehealth (HOSPITAL_COMMUNITY): Payer: Self-pay

## 2024-03-17 NOTE — Telephone Encounter (Signed)
 Attempted to call patient to get her scheduled in clinic with Dr. Bruce Caper this week at [facility] clinic. Unable to reach. Left message to call back.

## 2024-03-17 NOTE — Telephone Encounter (Signed)
-----   Message from [facility] sent at 03/16/2024 12:00 PM EDT ----- Sure, we will get her in.   Ruth Upshur, do you think we can get her in my clinic at [place] this week? ----- Message ----- From: Adria Hopkins, MD Sent: 03/13/2024  10:09 AM EDT To: Alwin Baars, DO  Dr. Bruce Caper,  This patient sees you for HFrEF due to hypertension cardiomyopathy. She was admitted at the end of April for exacerbation, was markedly hypertensive then. EF down to 25-30 from 40-45 last year. I saw her for hospital follow-up and she was symptomatically improved (nyha 1-2) but still volume overloaded. Started Lasix  20 mg daily and restarted some gdmt plus bidil . Anyway I'm hoping you have some bandwidth to see her in the near future.  Thanks, Adria Hopkins

## 2024-03-24 NOTE — Telephone Encounter (Signed)
 Tried to call patient regarding cardiology referral but no answer. Appointment scheduled for tomorrow at which point I will address this with her.  Adria Hopkins MD 03/24/2024, 9:23 AM

## 2024-03-25 ENCOUNTER — Encounter: Admitting: Student

## 2024-04-28 ENCOUNTER — Other Ambulatory Visit (HOSPITAL_COMMUNITY): Payer: Self-pay

## 2024-04-28 ENCOUNTER — Other Ambulatory Visit: Payer: Self-pay | Admitting: Student

## 2024-04-28 ENCOUNTER — Other Ambulatory Visit (HOSPITAL_COMMUNITY): Payer: Self-pay | Admitting: Cardiology

## 2024-04-28 DIAGNOSIS — E1165 Type 2 diabetes mellitus with hyperglycemia: Secondary | ICD-10-CM

## 2024-04-29 ENCOUNTER — Other Ambulatory Visit: Payer: Self-pay

## 2024-04-29 ENCOUNTER — Other Ambulatory Visit (HOSPITAL_COMMUNITY): Payer: Self-pay

## 2024-04-29 MED ORDER — CARVEDILOL 25 MG PO TABS
25.0000 mg | ORAL_TABLET | Freq: Two times a day (BID) | ORAL | 0 refills | Status: AC
  Filled 2024-04-29 – 2024-08-19 (×2): qty 180, 90d supply, fill #0

## 2024-04-29 MED ORDER — METFORMIN HCL 500 MG PO TABS
1000.0000 mg | ORAL_TABLET | Freq: Two times a day (BID) | ORAL | 0 refills | Status: DC
Start: 2024-04-29 — End: 2024-07-29
  Filled 2024-04-29: qty 120, 30d supply, fill #0

## 2024-04-29 NOTE — Telephone Encounter (Signed)
 Medication sent to pharmacy

## 2024-05-26 ENCOUNTER — Other Ambulatory Visit (HOSPITAL_COMMUNITY): Payer: Self-pay

## 2024-07-08 ENCOUNTER — Other Ambulatory Visit (HOSPITAL_COMMUNITY): Payer: Self-pay

## 2024-07-08 ENCOUNTER — Ambulatory Visit: Payer: Self-pay | Admitting: Student

## 2024-07-08 VITALS — BP 208/129 | HR 108 | Temp 98.4°F | Ht 61.0 in | Wt 190.2 lb

## 2024-07-08 DIAGNOSIS — Z91148 Patient's other noncompliance with medication regimen for other reason: Secondary | ICD-10-CM | POA: Diagnosis not present

## 2024-07-08 DIAGNOSIS — I10 Essential (primary) hypertension: Secondary | ICD-10-CM

## 2024-07-08 DIAGNOSIS — Z7984 Long term (current) use of oral hypoglycemic drugs: Secondary | ICD-10-CM

## 2024-07-08 DIAGNOSIS — Z79899 Other long term (current) drug therapy: Secondary | ICD-10-CM | POA: Diagnosis not present

## 2024-07-08 DIAGNOSIS — I5020 Unspecified systolic (congestive) heart failure: Secondary | ICD-10-CM | POA: Diagnosis not present

## 2024-07-08 DIAGNOSIS — I110 Hypertensive heart disease with heart failure: Secondary | ICD-10-CM

## 2024-07-08 DIAGNOSIS — E1165 Type 2 diabetes mellitus with hyperglycemia: Secondary | ICD-10-CM | POA: Diagnosis not present

## 2024-07-08 LAB — GLUCOSE, CAPILLARY: Glucose-Capillary: 178 mg/dL — ABNORMAL HIGH (ref 70–99)

## 2024-07-08 LAB — POCT GLYCOSYLATED HEMOGLOBIN (HGB A1C): HbA1c, POC (controlled diabetic range): 10.4 % — AB (ref 0.0–7.0)

## 2024-07-08 MED ORDER — SACUBITRIL-VALSARTAN 97-103 MG PO TABS
1.0000 | ORAL_TABLET | Freq: Two times a day (BID) | ORAL | 3 refills | Status: DC
  Filled 2024-07-08: qty 180, 90d supply, fill #0

## 2024-07-08 NOTE — Progress Notes (Unsigned)
 Established Patient Office Visit  Subjective   Patient ID: Ruth Edwards, female    DOB: [birth year]  Age: 58 y.o. MRN: [id number]  Chief Complaint  Patient presents with   routine checkup    Diabetes   Hypertension   Medication Refill    Ruth Edwards is a 58 y.o. who presents to the clinic for hypertension and T2DM follow up. Please see problem based assessment and plan for additional details.    Patient Active Problem List   Diagnosis Date Noted   HFrEF (heart failure with reduced ejection fraction) (HCC) 02/25/2024   Encounter to establish care 12/28/2022   Hyperlipidemia 12/28/2022   Constipation 12/28/2022   Class 2 obesity 12/10/2022   Chronic systolic heart failure (HCC) 12/10/2022   Uncontrolled type 2 diabetes mellitus with hyperglycemia, without long-term current use of insulin  (HCC) 12/09/2022   Essential hypertension 12/09/2022     Objective:     BP (!) 208/129 (BP Location: Left Arm, Patient Position: Sitting, Cuff Size: Normal)   Pulse (!) 108   Temp 98.4 F (36.9 C) (Oral)   Ht 5' 1 (1.549 m)   Wt 190 lb 3.2 oz (86.3 kg)   SpO2 98%   BMI 35.94 kg/m  BP Readings from Last 3 Encounters:  07/08/24 (!) 208/129  03/04/24 (!) 184/108  02/26/24 (!) 160/100   Wt Readings from Last 3 Encounters:  07/08/24 190 lb 3.2 oz (86.3 kg)  03/04/24 199 lb 9.6 oz (90.5 kg)  02/26/24 194 lb 3.6 oz (88.1 kg)      Physical Exam Vitals reviewed.  Constitutional:      General: She is not in acute distress.    Appearance: She is not ill-appearing, toxic-appearing or diaphoretic.  Neck:     Vascular: No JVD.  Cardiovascular:     Rate and Rhythm: Normal rate and regular rhythm.  Pulmonary:     Effort: Pulmonary effort is normal. No respiratory distress.     Breath sounds: Normal breath sounds. No wheezing or rales.  Musculoskeletal:     Right lower leg: Edema (1+ to the level of the mid-shin) present.     Left lower leg: Edema (1+ to the level of  the mid-shin) present.  Skin:    General: Skin is warm and dry.  Neurological:     Mental Status: She is alert.  Psychiatric:        Mood and Affect: Mood and affect normal.     Last metabolic panel Lab Results  Component Value Date   GLUCOSE 163 (H) 07/08/2024   NA 141 07/08/2024   K 3.8 07/08/2024   CL 103 07/08/2024   CO2 22 07/08/2024   BUN 15 07/08/2024   CREATININE 0.61 07/08/2024   EGFR 102 07/08/2024   CALCIUM  9.5 07/08/2024   PROT 7.0 02/25/2024   ALBUMIN 2.8 (L) 02/25/2024   BILITOT 0.7 02/25/2024   ALKPHOS 72 02/25/2024   AST 25 02/25/2024   ALT 18 02/25/2024   ANIONGAP 10 02/26/2024   Last lipids Lab Results  Component Value Date   CHOL 226 (H) 12/12/2022   HDL 60 12/12/2022   LDLCALC 149 (H) 12/12/2022   TRIG 86 12/12/2022   CHOLHDL 3.8 12/12/2022   Last hemoglobin A1c Lab Results  Component Value Date   HGBA1C 10.4 (A) 07/08/2024      The ASCVD Risk score (Arnett DK, et al., 2019) failed to calculate for the following reasons:   The valid systolic blood pressure range is 90 to  200 mmHg    Assessment & Plan:   Problem List Items Addressed This Visit       Cardiovascular and Mediastinum   Essential hypertension - Primary (Chronic)   Patient presents with a history of hypertension with a blood pressure today of 212/137, rechecked at 208/129.  Patient's hypertension is currently uncontrolled due to medication noncompliance.  She reports that she takes carvedilol  25 mg twice a day every day but in regards to her additional blood pressure management medicine including amlodipine  10 mg, spironolactone  25 mg, Entresto  97-103 twice a day, Lasix  20 mg daily: She only takes these medications about 3 out of 7 days a week.  She reported that she is not compliant with medications because she is unsure which medicines she takes and does not want to take the same medication twice.  She reported that she uses a pillbox and does not need assistance with filling  the pillbox, so I am unsure on the hesitation on the fear of doubling up on medications if she is using a pillbox appropriately.  Additionally, patient did not take any of her blood pressure medications prior to the appointment.  Fortunately, patient is asymptomatic today.  She denies chest pain, shortness of breath, headache, changes in vision, hematuria. We had a lengthy discussion on the importance of treating hypertension including reducing the risk of heart failure exacerbations, strokes and MI.  Plan: - Referral to our pharmacist in the [facility] placed today -Will continue Coreg  25 mg twice a day, refilled Entresto  97-103 twice a day, and Lasix  20 mg daily: I do not want to start all of her blood pressure medications at the same time for fear of hypotension due to patient's noncompliance - Patient was asked to come in in 2 days for a blood pressure check and if her blood pressure remains elevated, will resume spironolactone  with concurrent comorbidity of HFrEF. - Will discontinue amlodipine  10 mg at this time - A BMP was collected and is not show evidence of renal insufficiency - Patient was instructed to take her blood pressure medications when she goes home - 2-week follow-up for hypertension      Relevant Medications   sacubitril -valsartan  (ENTRESTO ) 97-103 MG   Other Relevant Orders   Basic metabolic panel with GFR (Completed)   AMB Referral [facility] Care Management   Liver Profile   HFrEF (heart failure with reduced ejection fraction) (HCC)   Relevant Medications   sacubitril -valsartan  (ENTRESTO ) 97-103 MG   Other Relevant Orders   AMB Referral [facility] Care Management   Ambulatory referral to Cardiology     Endocrine   Uncontrolled type 2 diabetes mellitus with hyperglycemia, without long-term current use of insulin  (HCC)    Patient presents with a history of T2DM with a prior A1c of 11.3 in April 2025.  Patient's A1c today is 10.4.  They are on a regimen of metformin  2,000 mg daily and  Jardiance  10 mg.  Patient also reports noncompliance with her metformin  and Jardiance  10 mg.  Type 2 diabetes is uncontrolled due to medication noncompliance.  As with the hypertension, I emphasized the importance of being compliant with medications. Plan: -Continue regimen of metformin  2000 mg daily and Jardiance  10 mg, I am hopeful that patient's A1c will begin to downtrend if she is compliant.  -A1c today -Ophthalmology referral placed -Urine ACR  ordered - Referral to [facility] pharmacist      Relevant Orders   POC Hbg A1C (Completed)   Ambulatory referral to Ophthalmology  Microalbumin / Creatinine Urine Ratio (Completed)   Ambulatory referral to Cardiology    Return in about 1 year (around 07/08/2025) for 2 days for BP check, 2 weeks with provider for HTN.    Damien Lease, DO

## 2024-07-08 NOTE — Patient Instructions (Signed)
 Thank you, Ruth Edwards for allowing us  to provide your care today. Today we discussed high blood pressure and diabetes.    For your very high blood pressure, please take the following medications - Carvedilol  25 mg twice a day - Entresto  97-103 mg twice a day, I sent a refill to the pharmacy for you -Continue to take Lasix  20 mg daily  Please return to the office in 2 days to check your blood pressure, be sure to take your medications 2 hours before you come to the office: If your blood pressure is still high, we will call you and add an additional medication at that time.  I will call you with the lab results, if the lab results are worrying I may have you go to the emergency department for additional treatment.  Check your blood pressure once a day and please keep a log of your blood pressure, bring this to the appointment with the blood pressure cuff so we can check that the blood pressure cuff at home is accurate.  I have ordered the following labs for you:   Lab Orders         Basic metabolic panel with GFR         Microalbumin / Creatinine Urine Ratio         POC Hbg A1C        Referrals ordered today:    Referral Orders         Ambulatory referral to Ophthalmology         AMB Referral VBCI Care Management      I have ordered the following medication/changed the following medications:   Stop the following medications: Medications Discontinued During This Encounter  Medication Reason   amLODipine  (NORVASC ) 10 MG tablet    isosorbide -hydrALAZINE  (BIDIL ) 20-37.5 MG tablet    spironolactone  (ALDACTONE ) 25 MG tablet    sacubitril -valsartan  (ENTRESTO ) 97-103 MG Reorder   sacubitril -valsartan  (ENTRESTO ) 97-103 MG      Start the following medications: Meds ordered this encounter  Medications   DISCONTD: sacubitril -valsartan  (ENTRESTO ) 97-103 MG    Sig: Take 1 tablet by mouth 2 (two) times daily.    Dispense:  180 tablet    Refill:  3   sacubitril -valsartan   (ENTRESTO ) 97-103 MG    Sig: Take 1 tablet by mouth 2 (two) times daily.    Dispense:  180 tablet    Refill:  3     Follow up: Please return in 2 days for a blood pressure check with a nurse Please return to the clinic in 2 weeks with a doctor    Should you have any questions or concerns please call the internal medicine clinic at [phone].     Please note that our late policy has changed.  If you are more than 15 minutes late to your appointment, you may be asked to reschedule your appointment.  Dr. Kandis, D.O. [facility]

## 2024-07-09 ENCOUNTER — Ambulatory Visit: Payer: Self-pay | Admitting: Student

## 2024-07-09 ENCOUNTER — Telehealth: Payer: Self-pay

## 2024-07-09 LAB — BASIC METABOLIC PANEL WITH GFR
BUN/Creatinine Ratio: 25 (ref 12–28)
BUN: 15 mg/dL (ref 8–27)
CO2: 22 mmol/L (ref 20–29)
Calcium: 9.5 mg/dL (ref 8.7–10.3)
Chloride: 103 mmol/L (ref 96–106)
Creatinine, Ser: 0.61 mg/dL (ref 0.57–1.00)
Glucose: 163 mg/dL — ABNORMAL HIGH (ref 70–99)
Potassium: 3.8 mmol/L (ref 3.5–5.2)
Sodium: 141 mmol/L (ref 134–144)
eGFR: 102 mL/min/1.73 (ref >59)

## 2024-07-09 LAB — MICROALBUMIN / CREATININE URINE RATIO
Creatinine, Urine: 88.4 mg/dL
Microalb/Creat Ratio: 115 mg/g{creat} — ABNORMAL HIGH (ref 0–29)
Microalbumin, Urine: 101.3 ug/mL

## 2024-07-09 NOTE — Progress Notes (Signed)
.  Care Guide Pharmacy Note  07/09/2024 Name: Darlisha Kelm MRN: [id number] DOB: 1966/04/05  Referred By: Nooruddin, Saad, MD Reason for referral: Complex Care Management (Outreach to schedule with Pharm d )   Ruth Edwards is a 58 y.o. year old female who is a primary care patient of Nooruddin, Saad, MD.  Rochele Lueck was referred to the pharmacist for assistance related to: CHF  An unsuccessful telephone outreach was attempted today to contact the patient who was referred to the pharmacy team for assistance with medication management. Additional attempts will be made to contact the patient.  Jeoffrey Buffalo , RMA     [facility]  [facility], [facility] Guide  Direct Dial: [phone]  Website: [email or web address]

## 2024-07-09 NOTE — Assessment & Plan Note (Signed)
  Patient presents with a history of T2DM with a prior A1c of 11.3 in April 2025.  Patient's A1c today is 10.4.  They are on a regimen of metformin  2,000 mg daily and Jardiance  10 mg.  Patient also reports noncompliance with her metformin  and Jardiance  10 mg.  Type 2 diabetes is uncontrolled due to medication noncompliance.  As with the hypertension, I emphasized the importance of being compliant with medications. Plan: -Continue regimen of metformin  2000 mg daily and Jardiance  10 mg, I am hopeful that patient's A1c will begin to downtrend if she is compliant.  -A1c today -Ophthalmology referral placed -Urine ACR  ordered - Referral to [facility] pharmacist

## 2024-07-09 NOTE — Assessment & Plan Note (Addendum)
 Patient presents with a history of hypertension with a blood pressure today of 212/137, rechecked at 208/129.  Patient's hypertension is currently uncontrolled due to medication noncompliance.  She reports that she takes carvedilol  25 mg twice a day every day but in regards to her additional blood pressure management medicine including amlodipine  10 mg, spironolactone  25 mg, Entresto  97-103 twice a day, Lasix  20 mg daily: She only takes these medications about 3 out of 7 days a week.  She reported that she is not compliant with medications because she is unsure which medicines she takes and does not want to take the same medication twice.  She reported that she uses a pillbox and does not need assistance with filling the pillbox, so I am unsure on the hesitation on the fear of doubling up on medications if she is using a pillbox appropriately.  Additionally, patient did not take any of her blood pressure medications prior to the appointment.  Fortunately, patient is asymptomatic today.  She denies chest pain, shortness of breath, headache, changes in vision, hematuria. We had a lengthy discussion on the importance of treating hypertension including reducing the risk of heart failure exacerbations, strokes and MI.  Plan: - Referral to our pharmacist in the [facility] placed today -Will continue Coreg  25 mg twice a day, refilled Entresto  97-103 twice a day, and Lasix  20 mg daily: I do not want to start all of her blood pressure medications at the same time for fear of hypotension due to patient's noncompliance - Patient was asked to come in in 2 days for a blood pressure check and if her blood pressure remains elevated, will resume spironolactone  with concurrent comorbidity of HFrEF. - Will discontinue amlodipine  10 mg at this time - A BMP was collected and is not show evidence of renal insufficiency - Patient was instructed to take her blood pressure medications when she goes home - 2-week follow-up for  hypertension - Patient was informed to go to the emergency department if she has chest pain, shortness of breath, headache, hematuria, or blurred vision

## 2024-07-10 LAB — HEPATIC FUNCTION PANEL
ALT: 9 IU/L (ref 0–32)
AST: 11 IU/L (ref 0–40)
Albumin: 4 g/dL (ref 3.8–4.9)
Alkaline Phosphatase: 86 IU/L (ref 44–121)
Bilirubin Total: 0.3 mg/dL (ref 0.0–1.2)
Bilirubin, Direct: 0.12 mg/dL (ref 0.00–0.40)
Total Protein: 7.6 g/dL (ref 6.0–8.5)

## 2024-07-10 LAB — SPECIMEN STATUS REPORT

## 2024-07-10 NOTE — Progress Notes (Signed)
 Internal Medicine Clinic Attending  Case discussed with the resident at the time of the visit.  We reviewed the resident's history and exam and pertinent patient test results.  I agree with the assessment, diagnosis, and plan of care documented in the resident's note.

## 2024-07-14 NOTE — Progress Notes (Unsigned)
 Care Guide Pharmacy Note  07/14/2024 Name: [name] MRN: [id number] DOB: March 31, 1966  Referred By: Nooruddin, Saad, MD Reason for referral: Complex Care Management (Outreach to schedule with Pharm d )   Ruth Edwards is a 58 y.o. year old female who is a primary care patient of Nooruddin, Saad, MD.  Rafaela Dinius was referred to the pharmacist for assistance related to: CHF  A second unsuccessful telephone outreach was attempted today to contact the patient who was referred to the pharmacy team for assistance with medication management. Additional attempts will be made to contact the patient.  Jeoffrey Buffalo , RMA     [facility]  [facility], [facility] Guide  Direct Dial: [phone]  Website: [email or web address]

## 2024-07-17 NOTE — Progress Notes (Signed)
 Care Guide Pharmacy Note  07/17/2024 Name: Ruth Edwards MRN: [id number] DOB: April 11, 1966  Referred By: Nooruddin, Saad, MD Reason for referral: Complex Care Management (Outreach to schedule with Pharm d )   Ruth Edwards is a 58 y.o. year old female who is a primary care patient of Nooruddin, Saad, MD.  Ruth Edwards was referred to the pharmacist for assistance related to: CHF  A third unsuccessful telephone outreach was attempted today to contact the patient who was referred to the pharmacy team for assistance with medication management. The Population Health team is pleased to engage with this patient at any time in the future upon receipt of referral and should he/she be interested in assistance from the [facility] team.  Jeoffrey Buffalo , RMA     [facility]  [facility], [facility] Guide  Direct Dial: [phone]  Website: [email or web address]

## 2024-07-29 ENCOUNTER — Ambulatory Visit

## 2024-07-29 ENCOUNTER — Other Ambulatory Visit (HOSPITAL_COMMUNITY): Payer: Self-pay

## 2024-07-29 DIAGNOSIS — E1165 Type 2 diabetes mellitus with hyperglycemia: Secondary | ICD-10-CM

## 2024-07-29 DIAGNOSIS — I5020 Unspecified systolic (congestive) heart failure: Secondary | ICD-10-CM

## 2024-07-29 DIAGNOSIS — I10 Essential (primary) hypertension: Secondary | ICD-10-CM | POA: Diagnosis not present

## 2024-07-29 MED ORDER — FUROSEMIDE 20 MG PO TABS
20.0000 mg | ORAL_TABLET | Freq: Every day | ORAL | 11 refills | Status: AC
  Filled 2024-07-29: qty 30, 30d supply, fill #0
  Filled 2024-11-03: qty 30, 30d supply, fill #1

## 2024-07-29 MED ORDER — METFORMIN HCL 500 MG PO TABS
1000.0000 mg | ORAL_TABLET | Freq: Two times a day (BID) | ORAL | 0 refills | Status: AC
  Filled 2024-07-29: qty 120, 30d supply, fill #0

## 2024-07-29 MED ORDER — SPIRONOLACTONE 25 MG PO TABS
25.0000 mg | ORAL_TABLET | Freq: Every day | ORAL | 0 refills | Status: AC
  Filled 2024-07-29: qty 30, 30d supply, fill #0

## 2024-07-29 MED ORDER — SACUBITRIL-VALSARTAN 97-103 MG PO TABS
1.0000 | ORAL_TABLET | Freq: Two times a day (BID) | ORAL | 3 refills | Status: AC
  Filled 2024-07-29 – 2024-11-03 (×3): qty 180, 90d supply, fill #0

## 2024-07-29 NOTE — Assessment & Plan Note (Signed)
 Patient was given regimen of Coreg  25 mg twice daily, Entresto  97-103 twice a day and Lasix  20 daily.  Patient also reports she is taking Imdur hydralazine  20-37.52 pills twice a day.  Patient has history of confusion with taking medications and has had poor adherence to this.  Patient states that she checks her blood pressure about once a week and the numbers were high but she could not remember what they were.  She has not been able to meet with [facility] pharmacist since last visit 2 weeks ago.  Blood pressure today is 203/115 and repeat was 200/101.  Plan: Will continue carvedilol  25 mg twice daily Entresto  97-103 twice a day Lasix  20 mg daily Imdur hydralazine  20-37.5 2 pills twice a day Spironolactone  25 mg started today's visit Counseled patient on trying to take her blood pressure at least once a day or at least for half the week and logging this

## 2024-07-29 NOTE — Patient Instructions (Addendum)
 Today we discussed the following medical conditions and plan:   For your blood pressure, I would like to see you back in two weeks. I have given you the chart of which medications you are taking and when to take them so please follow those instructions. If you could also try to check your blood pressure at the same time at least three times a week and write that down, it would be helpful too. We are starting you  on a new medication spironolactone  25 mg to help with your blood pressure.   Please use the number I provided to call the cardiologist for an appointment. This is the number [phone] for the cardiologist  We will see you back in 2 weeks to check your blood pressure again. At that visit we will also do the foot exam  We look forward to seeing you next time. Please call our clinic at [phone] if you have any questions or concerns. The best time to call is Monday-Friday from 9am-4pm, but there is someone available 24/7. If you need medication refills, please notify your pharmacy one week in advance and they will send us  a request.   Thank you for trusting me with your care. Wishing you the best!   Maynor Mwangi D'Mello, DO  [facility]

## 2024-07-29 NOTE — Assessment & Plan Note (Signed)
 Patient has not seen cardiologist in a while, did give patient the number for [place] [facility] on [address] to make an appointment.  Referral was sent in the last appointment but patient that they had not heard from them

## 2024-07-29 NOTE — Assessment & Plan Note (Signed)
 Patient reports that before breakfast her blood sugars usually in the 150s but she does not recall what her other blood sugars are and did not have the written down.  Did counsel patient on writing these down and bringing them in at the next visit.  Continue on metformin  1000 mg twice a day and Jardiance  10 mg.  Last A1c at office visit on 9/10 was 10.4. Will continue metformin  1000 mg twice a day, Jardiance  10 mg Will do foot exam at next office visit Ophthalmology referral is pending.  Will check in to see status of this and if new it needs to be sent in

## 2024-07-29 NOTE — Progress Notes (Signed)
 Established Patient Office Visit  Subjective   Patient ID: Ruth Edwards, female    DOB: [birth year]  Age: 58 y.o. MRN: [id number]  Chief Complaint  Patient presents with   Hypertension    HPI Kalea Coderre is a 58 year old female with past medical history of hyperlipidemia, uncontrolled type 2 diabetes who presents today for follow-up.  See problem-based assessment for more details   ROS See problem-based assessment for more details   Objective:     BP (!) 200/101 (BP Location: Left Arm, Patient Position: Sitting, Cuff Size: Small)   Pulse 78   Temp 98.1 F (36.7 C) (Oral)   Wt 191 lb 9.6 oz (86.9 kg)   SpO2 98%   BMI 36.20 kg/m  BP Readings from Last 3 Encounters:  07/29/24 (!) 200/101  07/08/24 (!) 208/129  03/04/24 (!) 184/108   Wt Readings from Last 3 Encounters:  07/29/24 191 lb 9.6 oz (86.9 kg)  07/08/24 190 lb 3.2 oz (86.3 kg)  03/04/24 199 lb 9.6 oz (90.5 kg)      Physical Exam Constitution: Alert no acute distress Heart: Regular rate and rhythm, yeah no murmurs Lungs: Respiratory effort normal, clear to ausculation, no rales or wheezes heard  Lower extremities: 2+ bilateral pitting edema, no calf tenderness bilaterally, skin is supple  Feet: no ulcers or other lesions noted. DP pulses palpable and feet warm.   No results found for any visits on 07/29/24.  Last hemoglobin A1c Lab Results  Component Value Date   HGBA1C 10.4 (A) 07/08/2024      The 10-year ASCVD risk score (Arnett DK, et al., 2019) is: 49%    Assessment & Plan:   Problem List Items Addressed This Visit     Uncontrolled type 2 diabetes mellitus with hyperglycemia, without long-term current use of insulin  (HCC)   Patient reports that before breakfast her blood sugars usually in the 150s but she does not recall what her other blood sugars are and did not have the written down.  Did counsel patient on writing these down and bringing them in at the next visit.   Continue on metformin  1000 mg twice a day and Jardiance  10 mg.  Last A1c at office visit on 9/10 was 10.4. Will continue metformin  1000 mg twice a day, Jardiance  10 mg Will do foot exam at next office visit Ophthalmology referral is pending.  Will check in to see status of this and if new it needs to be sent in       Relevant Medications   metFORMIN  (GLUCOPHAGE ) 500 MG tablet   Essential hypertension (Chronic)   Patient was given regimen of Coreg  25 mg twice daily, Entresto  97-103 twice a day and Lasix  20 daily.  Patient also reports she is taking Imdur hydralazine  20-37.52 pills twice a day.  Patient has history of confusion with taking medications and has had poor adherence to this.  Patient states that she checks her blood pressure about once a week and the numbers were high but she could not remember what they were.  She has not been able to meet with [facility] pharmacist since last visit 2 weeks ago.  Blood pressure today is 203/115 and repeat was 200/101.  Plan: Will continue carvedilol  25 mg twice daily Entresto  97-103 twice a day Lasix  20 mg daily Imdur hydralazine  20-37.5 2 pills twice a day Spironolactone  25 mg started today's visit Counseled patient on trying to take her blood pressure at least once a day or at least for half  the week and logging this      Relevant Medications   sacubitril -valsartan  (ENTRESTO ) 97-103 MG   spironolactone  (ALDACTONE ) 25 MG tablet   furosemide  (LASIX ) 20 MG tablet   HFrEF (heart failure with reduced ejection fraction) (HCC)   Patient has not seen cardiologist in a while, did give patient the number for [place] [facility] on [address] to make an appointment.  Referral was sent in the last appointment but patient that they had not heard from them      Relevant Medications   sacubitril -valsartan  (ENTRESTO ) 97-103 MG   spironolactone  (ALDACTONE ) 25 MG tablet   furosemide  (LASIX ) 20 MG tablet       Return in about 2 weeks (around  08/12/2024).    Neidy Guerrieri D'Mello, DO Patient seen with Dr. Trudy

## 2024-07-31 NOTE — Progress Notes (Signed)
 Internal Medicine Clinic Attending  I was physically present during the key portions of the resident provided service and participated in the medical decision making of patient's management care. I reviewed pertinent patient test results.  The assessment, diagnosis, and plan were formulated together and I agree with the documentation in the resident's note. Patient with cultural  and medical literacy barriers. She is asymptomatic despite her high readings.  Spent time explaining in layman's terms what high blood pressure is and what the consequences are.  She is motivated to remain healthy and with appropriate understanding should be able to follow a regimen if it is carefully explained, and if she can understand what her medicines are for and how to take them.   Trudy Mliss Dragon, MD

## 2024-08-07 ENCOUNTER — Other Ambulatory Visit (HOSPITAL_COMMUNITY): Payer: Self-pay

## 2024-08-10 ENCOUNTER — Other Ambulatory Visit (HOSPITAL_COMMUNITY): Payer: Self-pay

## 2024-08-12 ENCOUNTER — Ambulatory Visit

## 2024-08-19 ENCOUNTER — Other Ambulatory Visit (HOSPITAL_COMMUNITY): Payer: Self-pay

## 2024-08-21 ENCOUNTER — Other Ambulatory Visit (HOSPITAL_COMMUNITY): Payer: Self-pay

## 2024-09-09 ENCOUNTER — Ambulatory Visit: Payer: Self-pay | Admitting: Student

## 2024-09-09 ENCOUNTER — Other Ambulatory Visit (HOSPITAL_COMMUNITY): Payer: Self-pay

## 2024-09-09 VITALS — BP 208/108 | HR 81 | Temp 98.0°F | Ht 61.0 in | Wt 192.6 lb

## 2024-09-09 DIAGNOSIS — I10 Essential (primary) hypertension: Secondary | ICD-10-CM | POA: Diagnosis present

## 2024-09-09 MED ORDER — ISOSORB DINITRATE-HYDRALAZINE 20-37.5 MG PO TABS
2.0000 | ORAL_TABLET | Freq: Three times a day (TID) | ORAL | 3 refills | Status: AC
  Filled 2024-09-09: qty 90, 23d supply, fill #0
  Filled 2024-09-10: qty 180, 30d supply, fill #0
  Filled 2024-11-03: qty 180, 30d supply, fill #1

## 2024-09-09 NOTE — Progress Notes (Signed)
 Internal Medicine Clinic Attending  Case discussed with the resident at the time of the visit.  We reviewed the resident's history and exam and pertinent patient test results.  I agree with the assessment, diagnosis, and plan of care documented in the resident's note.   20F well known to our clinic with persistent severe hypertension despite addition of multiple antihypertensive agents. She has had virtually no improvement in BP since visit in 06/2024 despite addition of 3 agents in the interim which does raise doubts about her compliance to these medications despite what she reports. She does admit to running out of Bidil  which we will refill today, will refer to advanced hypertension clinic. We will begin secondary HTN workup today as below. Extensive counseling provided.

## 2024-09-09 NOTE — Patient Instructions (Addendum)
 Thank you so much for coming to the clinic today!   I am restarting that Bidil  medication that you will take TWO pills TWICE a day  I am also sending in a referral to the Blood Pressure clinic  I am also getting some labs today, and placing an order to ultrasound your kidneys   [facility] at [facility] [address] [place],  [place]  [address] Main: [phone]   If you have any questions please feel free to the call the clinic at anytime at [phone]. It was a pleasure seeing you!  Best, Dr. Jacquilyn Seldon

## 2024-09-10 ENCOUNTER — Other Ambulatory Visit: Payer: Self-pay | Admitting: Student

## 2024-09-10 ENCOUNTER — Other Ambulatory Visit (HOSPITAL_COMMUNITY): Payer: Self-pay

## 2024-09-10 NOTE — Assessment & Plan Note (Addendum)
 Poorly controlled hypertension with recent readings of 218/200 mmHg.  Unclear what happened with her BiDil , but will restart this.  She is adamant that she is taking her medications, so at this point we will start a secondary hypertension workup starting with renin and aldosterone levels and ultrasound of her kidneys. - Restarted BiDil , increased dose to two tablets 3 times daily. - Referred to a hypertension clinic for specialized management. - Ordered labs to assess kidney function. - Ordered renal ultrasound - Continuing her Coreg  25 mg twice a day, Entresto  97-103 twice a day, Lasix  20 mg, and spironolactone  25 mg

## 2024-09-10 NOTE — Progress Notes (Signed)
 CC: Hypertension follow-up  HPI:  [name] is a 58 y.o. female living with a history stated below and presents today for hypertension follow-up. Please see problem based assessment and plan for additional details.  Discussed the use of AI scribe software for clinical note transcription with the patient, who gave verbal consent to proceed.  History of Present Illness Ruth Edwards is a 58 year old female with hypertension and heart failure who presents for medication management and follow-up.  She did bring in her medications with her today, and states that she has been taking all of them.  Her blood pressure was previously recorded at 200/101 and 218/108. She recalls being hospitalized in April for heart issues, where it was noted that her heart was not squeezing as well as it should.  She has made dietary changes, reducing salt and food intake, and typically eats breakfast in the morning and then not again until 4 or 5 PM. Despite these changes, she is unsure why her blood pressure remains high.  No chest pain or shortness of breath.    Past Medical History:  Diagnosis Date   Acute exacerbation of CHF (congestive heart failure) (HCC) 02/25/2024   DM2 (diabetes mellitus, type 2) (HCC)    HTN (hypertension)    Hypertensive urgency 02/25/2024    Current Outpatient Medications on File Prior to Visit  Medication Sig Dispense Refill   Accu-Chek Softclix Lancets lancets Use as directed up to 4 times daily 100 each 0   atorvastatin  (LIPITOR ) 80 MG tablet Take 1 tablet (80 mg total) by mouth daily. 30 tablet 11   blood glucose meter kit and supplies Dispense based on patient and insurance preference. Use up to four times daily as directed. (FOR ICD-10 E10.9, E11.9). 1 each 0   Blood Glucose Monitoring Suppl (ACCU-CHEK GUIDE) w/Device KIT USE AS DIRECTED 1 kit 0   carvedilol  (COREG ) 25 MG tablet Take 1 tablet (25 mg total) by mouth 2 (two) times daily. NEEDS FOLLOW  UP APPOINTMENT FOR MORE REFILLS 180 tablet 0   empagliflozin  (JARDIANCE ) 10 MG TABS tablet Take 1 tablet (10 mg total) by mouth daily. 30 tablet 11   furosemide  (LASIX ) 20 MG tablet Take 1 tablet (20 mg total) by mouth daily. 30 tablet 11   glucose blood test strip Use as directed 100 each 0   metFORMIN  (GLUCOPHAGE ) 500 MG tablet Take 2 tablets (1,000 mg total) by mouth 2 (two) times daily with a meal. 120 tablet 0   sacubitril -valsartan  (ENTRESTO ) 97-103 MG Take 1 tablet by mouth 2 (two) times daily. 180 tablet 3   spironolactone  (ALDACTONE ) 25 MG tablet Take 1 tablet (25 mg total) by mouth daily. 30 tablet 0   No current facility-administered medications on file prior to visit.    No family history on file.  Social History   Socioeconomic History   Marital status: Married    Spouse name: Ruth Edwards   Number of children: Not on file   Years of education: Not on file   Highest education level: Not on file  Occupational History   Not on file  Tobacco Use   Smoking status: Never   Smokeless tobacco: Never  Vaping Use   Vaping status: Never Used  Substance and Sexual Activity   Alcohol use: Never   Drug use: Never   Sexual activity: Yes  Other Topics Concern   Not on file  Social History Narrative   Not on file   Social Drivers of Health  Financial Resource Strain: High Risk (12/10/2022)   Overall Financial Resource Strain (CARDIA)    Difficulty of Paying Living Expenses: Hard  Food Insecurity: Patient Declined (02/25/2024)   Hunger Vital Sign    Worried About Running Out of Food in the Last Year: Patient declined    Ran Out of Food in the Last Year: Patient declined  Transportation Needs: No Transportation Needs (02/25/2024)   PRAPARE - Administrator, Civil Service (Medical): No    Lack of Transportation (Non-Medical): No  Physical Activity: Not on file  Stress: Not on file  Social Connections: Unknown (02/25/2024)   Social Connection and Isolation Panel     Frequency of Communication with Friends and Family: Once a week    Frequency of Social Gatherings with Friends and Family: Patient declined    Attends Religious Services: Patient declined    Database Administrator or Organizations: Not on file    Attends Banker Meetings: Patient declined    Marital Status: Patient declined  Intimate Partner Violence: Unknown (02/25/2024)   Humiliation, Afraid, Rape, and Kick questionnaire    Fear of Current or Ex-Partner: Not on file    Emotionally Abused: No    Physically Abused: No    Sexually Abused: No    Review of Systems: ROS negative except for what is noted on the assessment and plan.  Vitals:   09/09/24 1427 09/09/24 1447  BP: (!) 219/117 (!) 208/108  Pulse: 92 81  Temp: 98 F (36.7 C)   TempSrc: Oral   SpO2: 100%   Weight: 192 lb 9.6 oz (87.4 kg)   Height: 5' 1 (1.549 m)     Physical Exam: Constitutional: well-appearing female in no acute distress Cardiovascular: regular rate and rhythm, no m/r/g Pulmonary/Chest: normal work of breathing on room air, lungs clear to auscultation bilaterally Abdominal: soft, non-tender, non-distended  Assessment & Plan:   Essential hypertension Poorly controlled hypertension with recent readings of 218/200 mmHg.  Unclear what happened with her BiDil , but will restart this.  She is adamant that she is taking her medications, so at this point we will start a secondary hypertension workup starting with renin and aldosterone levels and ultrasound of her kidneys. - Restarted BiDil , increased dose to two tablets 3 times daily. - Referred to a hypertension clinic for specialized management. - Ordered labs to assess kidney function. - Ordered renal ultrasound - Continuing her Coreg  25 mg twice a day, Entresto  97-103 twice a day, Lasix  20 mg, and spironolactone  25 mg   Patient discussed with Dr. Shawn Dirks Aminta Sakurai, M.D. [facility], PGY-3 Pager: [phone] Date  09/10/2024 Time 9:39 AM

## 2024-09-14 ENCOUNTER — Ambulatory Visit: Payer: Self-pay | Admitting: Student

## 2024-09-14 DIAGNOSIS — E269 Hyperaldosteronism, unspecified: Secondary | ICD-10-CM

## 2024-09-14 LAB — ALDOSTERONE + RENIN ACTIVITY W/ RATIO
Aldos/Renin Ratio: >40.7 — ABNORMAL HIGH (ref 0.0–30.0)
Aldosterone: 6.8 ng/dL (ref 0.0–30.0)
Renin Activity, Plasma: <0.167 ng/mL/h — ABNORMAL LOW (ref 0.167–5.380)

## 2024-10-30 ENCOUNTER — Encounter (HOSPITAL_BASED_OUTPATIENT_CLINIC_OR_DEPARTMENT_OTHER): Payer: Self-pay

## 2024-11-03 ENCOUNTER — Other Ambulatory Visit (HOSPITAL_COMMUNITY): Payer: Self-pay
